# Patient Record
Sex: Female | Born: 1995 | Race: White | Hispanic: No | Marital: Single | Smoking: Never smoker
Health system: Southern US, Community
[De-identification: ages and names within clinical notes are randomized; demographics above are authoritative.]

## PROBLEM LIST (undated history)

## (undated) DIAGNOSIS — K589 Irritable bowel syndrome without diarrhea: Secondary | ICD-10-CM

## (undated) DIAGNOSIS — G43909 Migraine, unspecified, not intractable, without status migrainosus: Secondary | ICD-10-CM

---
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
## 2017-03-23 ENCOUNTER — Other Ambulatory Visit: Payer: Self-pay | Admitting: Family Medicine

## 2017-03-23 DIAGNOSIS — Z0000 Encounter for general adult medical examination without abnormal findings: Secondary | ICD-10-CM

## 2017-03-25 MED FILL — LARIN 24 FE 1 MG-20 MCG TAB: 1-20 | 28 days supply | Qty: 28 | Fill #0

## 2017-03-25 NOTE — Telephone Encounter (Signed)
Pt has transfer appt next Thursday, 9/13. However is out of her birth control LARIN 24 FE 1-20 MG-MCG(24) tablet  Can you send in one mo supply to get her through? Pt states she is out.  [facility] [place] - [address]

## 2017-03-25 NOTE — Telephone Encounter (Signed)
Pt is requesting for refills on her birth control medication last filled on 09/28/2016 28 tablets with 3 refills. Patient was last seen on 08/26/2016 .Please advised if ok to refill.

## 2017-03-25 NOTE — Telephone Encounter (Signed)
Patient aware that her birth control has been refilled x 1 month to [facility]. Nothing further needed.

## 2017-03-25 NOTE — Telephone Encounter (Signed)
Okay to refill? 

## 2017-04-01 ENCOUNTER — Encounter: Payer: Self-pay | Admitting: Family Medicine

## 2017-04-01 ENCOUNTER — Ambulatory Visit (INDEPENDENT_AMBULATORY_CARE_PROVIDER_SITE_OTHER): Payer: PRIVATE HEALTH INSURANCE | Admitting: Family Medicine

## 2017-04-01 VITALS — BP 136/84 | HR 112 | Temp 98.3°F | Ht 62.0 in | Wt 176.0 lb

## 2017-04-01 DIAGNOSIS — Z7689 Persons encountering health services in other specified circumstances: Secondary | ICD-10-CM | POA: Diagnosis not present

## 2017-04-01 DIAGNOSIS — Z3041 Encounter for surveillance of contraceptive pills: Secondary | ICD-10-CM

## 2017-04-01 MED ORDER — LARIN 24 FE 1-20 MG-MCG(24) PO TABS: 1.0000 | ORAL_TABLET | Freq: Every day | ORAL | 11 refills | Status: DC

## 2017-04-01 NOTE — Progress Notes (Signed)
Patient presents to clinic today to re-establish care and for birth control.  SUBJECTIVE: PMH: Pt is a 21 yo CF with no sig pmh.  She is here today for refill on OCPs.  Pt has been on Larin with no issues x 2 yrs.  Pt does not smoke and does not have a h/o blood clots.  Denies HA, changes in vision, h/o anemia.  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  She does not smoke, drink, or use drugs.    Health Maintenance: Immunizations -- not interested in influenza 2/2 still getting the flu after receiving a flu shot. PAP --  Not indicated, not 5 yo yet  FMHx: Mom: AAW Dad: AAW MGM: chronic pneumonia MGF: tobacco abuse, lung ca PGM: lung cancer, tobacco use PGF: tobacco abuse, lung ca  No past medical history on file.  No past surgical history on file.  Current Outpatient Prescriptions on File Prior to Visit  Medication Sig Dispense Refill  . hyoscyamine (LEVSIN, ANASPAZ) 0.125 MG tablet Take 1 tablet (0.125 mg total) by mouth every 4 (four) hours as needed. 30 tablet 0   No current facility-administered medications on file prior to visit.     No Known Allergies  Family History  Problem Relation Age of Onset  . Alcohol abuse Paternal Uncle   . Alcohol abuse Maternal Grandfather     Social History   Social History  . Marital status: Single    Spouse name: N/A  . Number of children: N/A  . Years of education: N/A   Occupational History  . Not on file.   Social History Main Topics  . Smoking status: Never Smoker  . Smokeless tobacco: Never Used  . Alcohol use No  . Drug use: No  . Sexual activity: Yes    Birth control/ protection: OCP   Other Topics Concern  . 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   One sister who is 16    ROS  General: Denies fever, chills, night sweats, changes in weight, changes in appetite HEENT:  Denies headaches, ear pain, changes in vision, rhinorrhea, sore throat CV: Denies CP, palpitations, SOB, orthopnea Pulm: Denies SOB, cough, wheezing GI: Denies abdominal pain, nausea, vomiting, diarrhea, constipation GU: Denies dysuria, hematuria, frequency, vaginal discharge Msk: Denies muscle cramps, joint pains Neuro: Denies weakness, numbness, tingling Skin: Denies rashes, bruising Psych: Denies depression, anxiety, hallucinations   BP 136/84 (BP Location: Left Arm, Patient Position: Sitting, Cuff Size: Normal)   Pulse (!) 112   Temp 98.3 F (36.8 C) (Oral)   Ht  (1.575 m)   Wt 176 lb (79.8 kg)   SpO2 98%   BMI 32.19 kg/m   Physical Exam Gen. Pleasant, well developed, well-nourished, in NAD HEENT - [place]/AT, PERRL, no scleral icterus, no nasal drainage, pharynx without erythema or exudate. Neck: No JVD, no thyromegaly Lungs: no accessory muscle use, no dullness to percussion, CTAB, no wheezes, rales or rhonchi Cardiovascular: RRR, No r/g/m, no peripheral edema Abdomen: BS present, soft, nontender,nondistended, no hepatosplenomegaly Musculoskeletal: No deformities, moves all four extremities, no cyanosis or clubbing, normal tone Neuro:  A&Ox3, CN II-XII intact, normal gait Skin:  Warm, dry, intact, no lesions Psych: normal affect, mood appropriate  No results found for this or any previous visit (from the past 2160 hour(s)).  Assessment/Plan: Surveillance for birth control, oral contraceptives  -  Plan: LARIN 24 FE 1-20 MG-MCG(24) tablet  Encounter to establish care -f/u prn.  Can schedule CPE in next few months -declined flu vaccine

## 2017-04-08 ENCOUNTER — Encounter: Payer: Self-pay | Admitting: Family Medicine

## 2017-04-22 MED FILL — LARIN 24 FE 1 MG-20 MCG TAB: 1-20 | 28 days supply | Qty: 28 | Fill #0

## 2017-05-17 MED FILL — LARIN 24 FE 1 MG-20 MCG TAB: 1-20 | 28 days supply | Qty: 28 | Fill #1

## 2017-05-18 ENCOUNTER — Other Ambulatory Visit: Payer: Self-pay | Admitting: Emergency Medicine

## 2017-05-18 DIAGNOSIS — Z3041 Encounter for surveillance of contraceptive pills: Secondary | ICD-10-CM

## 2017-05-18 MED ORDER — LARIN 24 FE 1-20 MG-MCG(24) PO TABS
1.0000 | ORAL_TABLET | Freq: Every day | ORAL | 3 refills | Status: DC
Start: 2017-05-18 — End: 2017-06-21

## 2017-06-18 ENCOUNTER — Telehealth: Payer: Self-pay | Admitting: Family Medicine

## 2017-06-18 MED FILL — BLISOVI 24 FE TABLET: 1-20 | 28 days supply | Qty: 28 | Fill #2

## 2017-06-18 NOTE — Telephone Encounter (Signed)
Copied from CRM [id number]. Topic: Quick Communication - See Telephone Encounter >> Jun 18, 2017  1:47 PM Diana EvesHoyt, Maryann B wrote: CRM for notification. See Telephone encounter for:  Pharmacy no longer carries the larin but has an equivalent and would like a sign off Blisovi 24FE  06/18/17. >> Jun 18, 2017  4:40 PM Cipriano BunkerLambe, Annette S wrote: Patient called - pharmacy closes at 5 ([facility] Medication - birth control CRM sent from pharmacy. Patient said if cant by 5pm call into [facility] [address] [place]

## 2017-06-18 NOTE — Telephone Encounter (Signed)
Please advise 

## 2017-06-18 NOTE — Telephone Encounter (Signed)
Copied from CRM [id number]. Topic: Quick Communication - See Telephone Encounter >> Jun 18, 2017  1:47 PM Diana EvesHoyt, Maryann B wrote: CRM for notification. See Telephone encounter for:  Pharmacy no longer carries the larin but has an equivalent and would like a sign off Blisovi 24FE  06/18/17.

## 2017-06-19 NOTE — Telephone Encounter (Signed)
This was sent in error to me. Dr. Abbe AmsterdamShannon Banks saw her. I routed to her

## 2017-06-21 ENCOUNTER — Other Ambulatory Visit: Payer: Self-pay | Admitting: Emergency Medicine

## 2017-06-21 MED ORDER — NORETHIN ACE-ETH ESTRAD-FE 1-20 MG-MCG(24) PO TABS: 1.0000 | ORAL_TABLET | Freq: Every day | ORAL | 4 refills | Status: DC

## 2017-06-21 NOTE — Telephone Encounter (Signed)
Medication has been sent to patient pharmacy.

## 2017-06-21 NOTE — Telephone Encounter (Signed)
Dr.Banks pt 

## 2017-06-21 NOTE — Telephone Encounter (Signed)
That's fine

## 2017-07-19 MED FILL — BLISOVI 24 FE TABLET: 1-20 | 28 days supply | Qty: 28 | Fill #3

## 2017-09-07 ENCOUNTER — Ambulatory Visit: Payer: PRIVATE HEALTH INSURANCE | Admitting: Family Medicine

## 2017-09-07 ENCOUNTER — Encounter: Payer: Self-pay | Admitting: *Deleted

## 2017-09-07 ENCOUNTER — Encounter: Payer: Self-pay | Admitting: Family Medicine

## 2017-09-07 VITALS — BP 120/78 | HR 88 | Temp 98.3°F | Resp 12 | Ht 62.01 in | Wt 184.4 lb

## 2017-09-07 DIAGNOSIS — J069 Acute upper respiratory infection, unspecified: Secondary | ICD-10-CM | POA: Diagnosis not present

## 2017-09-07 DIAGNOSIS — J029 Acute pharyngitis, unspecified: Secondary | ICD-10-CM

## 2017-09-07 LAB — POCT RAPID STREP A (OFFICE): RAPID STREP A SCREEN: NEGATIVE

## 2017-09-07 MED ORDER — FLUTICASONE PROPIONATE 50 MCG/ACT NA SUSP: 1.0000 | Freq: Two times a day (BID) | NASAL | 0 refills | Status: DC

## 2017-09-07 MED ORDER — MAGIC MOUTHWASH W/LIDOCAINE: 5.0000 mL | Freq: Three times a day (TID) | ORAL | 0 refills | Status: AC | PRN

## 2017-09-07 NOTE — Patient Instructions (Signed)
  Jennifer Roach I have seen you today for an acute visit.  A few things to remember from today's visit:   Sore throat - Plan: POCT rapid strep A, magic mouthwash w/lidocaine SOLN  URI, acute - Plan: fluticasone (FLONASE) 50 MCG/ACT nasal spray    Symptomatic treatment: Over the counter Acetaminophen 500 mg and/or Ibuprofen (400-600 mg) if there is not contraindications; you can alternate in between both every 4-6 hours. Gargles with saline water and throat lozenges might also help. Cold fluids.    Seek prompt medical evaluation if you are having difficulty breathing, mouth swelling, throat closing up, not able to swallow liquids (drooling), skin rash/bruising, or worsening symptoms.  Please follow up in 2 weeks if not any better.      In general please monitor for signs of worsening symptoms and seek immediate medical attention if any concerning.    I hope you get better soon!

## 2017-09-07 NOTE — Progress Notes (Signed)
ACUTE VISIT  HPI:  Chief Complaint  Patient presents with  . Sore Throat    painful when swallowing, started 4 days ago  . Cough    Ms.Jennifer RegisterRebecca L Roach is a 22 y.o.female here today complaining of 4 days of sore throat. Pain is mild to moderate, exacerbated by swallowing. She denies dysphagia or stridor.  Sore Throat   This is a new problem. The current episode started in the past 7 days. The problem has been unchanged. There has been no fever. The pain is moderate. Associated symptoms include congestion and coughing. Pertinent negatives include no abdominal pain, diarrhea, ear pain, headaches, hoarse voice, plugged ear sensation, neck pain, shortness of breath, stridor, swollen glands, trouble swallowing or vomiting. She has had no exposure to strep or mono. She has tried nothing for the symptoms.    + Nasal congestion, rhinorrhea, and post nasal drainage.   No Hx of recent travel. No known sick contact, but she works in a [employer]. No known insect bite.  Hx of allergies: Denies  She has not try OTC analgesics.  Symptoms otherwise stable.   Review of Systems  Constitutional: Positive for chills and fatigue. Negative for activity change, appetite change and fever.  HENT: Positive for congestion, postnasal drip, rhinorrhea and sore throat. Negative for ear pain, facial swelling, hoarse voice, mouth sores, sinus pressure, sneezing, trouble swallowing and voice change.   Eyes: Negative for discharge and redness.  Respiratory: Positive for cough. Negative for shortness of breath, wheezing and stridor.   Gastrointestinal: Negative for abdominal pain, diarrhea, nausea and vomiting.  Musculoskeletal: Negative for myalgias and neck pain.  Skin: Negative for rash.  Allergic/Immunologic: Negative for environmental allergies.  Neurological: Negative for weakness and headaches.  Hematological: Negative for adenopathy. Does not bruise/bleed easily.       Current  Outpatient Medications on File Prior to Visit  Medication Sig Dispense Refill  . hyoscyamine (LEVSIN, ANASPAZ) 0.125 MG tablet Take 1 tablet (0.125 mg total) by mouth every 4 (four) hours as needed. 30 tablet 0  . Norethindrone Acetate-Ethinyl Estrad-FE (BLISOVI 24 FE) 1-20 MG-MCG(24) tablet Take 1 tablet by mouth daily. 1 Package 4   No current facility-administered medications on file prior to visit.      History reviewed. No pertinent past medical history. Allergies  Allergen Reactions  . No Known Allergies     Social History   Socioeconomic History  . Marital status: Single    Spouse name: None  . Number of children: None  . Years of education: None  . Highest education level: None  Social Needs  . Financial resource strain: None  . Food insecurity - worry: None  . Food insecurity - inability: None  . Transportation needs - medical: None  . Transportation needs - non-medical: None  Occupational History  . None  Tobacco Use  . Smoking status: Never Smoker  . Smokeless tobacco: Never Used  Substance and Sexual Activity  . Alcohol use: No    Alcohol/week: 0.0 oz  . Drug use: No  . Sexual activity: Yes    Birth control/protection: OCP  Other Topics Concern  . 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   Physical Exam  Nursing note and vitals reviewed. Constitutional: She is oriented to person, place, and time. She appears well-developed. She does not appear ill. No distress.  HENT:  Head: Normocephalic and atraumatic.  Nose: Rhinorrhea present. Right sinus exhibits no maxillary sinus tenderness and no frontal sinus tenderness. Left sinus exhibits no maxillary sinus tenderness and no frontal sinus tenderness.  Mouth/Throat: Uvula is midline and mucous  membranes are normal. Posterior oropharyngeal erythema present. No oropharyngeal exudate or posterior oropharyngeal edema.  Moderately hypertrophic turbinates  mouth breathing.   Eyes: Conjunctivae are normal.  Neck: No muscular tenderness present. No edema and no erythema present.  Cardiovascular: Normal rate and regular rhythm.  No murmur heard. Respiratory: Effort normal and breath sounds normal. No stridor. No respiratory distress.  Lymphadenopathy:       Head (right side): No submandibular adenopathy present.       Head (left side): No submandibular adenopathy present.    She has cervical adenopathy.       Right cervical: Posterior cervical adenopathy present.       Left cervical: Posterior cervical adenopathy present.  Neurological: She is alert and oriented to person, place, and time. She has normal strength. Gait normal.  Skin: Skin is warm. No rash noted. No erythema.  Psychiatric: She has a normal mood and affect.  Well groomed, good eye contact.    ASSESSMENT AND PLAN:   [name] was seen today for sore throat and cough.  Diagnoses and all orders for this visit:  Sore throat -     POCT rapid strep A -     magic mouthwash w/lidocaine SOLN; Take 5 mLs by mouth 3 (three) times daily as needed for up to 10 days for mouth pain. 50 ml of diphenhydramine, alum and mag hydroxide, and lidocaine to make 150 ml -     Culture, Group A Strep  URI, acute -     fluticasone (FLONASE) 50 MCG/ACT nasal spray; Place 1 spray into both nostrils 2 (two) times daily. -     Culture, Group A Strep   Rapid strep negative. Strep Cx was sent. Symptoms suggests a viral etiology, symptomatic treatment recommended for now, I do not think abx is needed at this time. Instructed to monitor for signs of complications, including new onset of fever among some, clearly instructed about warning signs. We will follow throat culture and give recommendations accordingly. Note for work was given  today. I also explained that cough and nasal congestion can last a few days and sometimes weeks. F/U as needed.     -[name] was advised to seek attention immediately if symptoms worsen or to follow if they persist or new concerns arise.       Betty G. [place], MD  [facility]. [place] office.

## 2017-09-09 LAB — CULTURE, GROUP A STREP
MICRO NUMBER: 90218454
SPECIMEN QUALITY:: ADEQUATE

## 2017-09-12 ENCOUNTER — Encounter: Payer: Self-pay | Admitting: Family Medicine

## 2017-10-04 ENCOUNTER — Other Ambulatory Visit: Payer: Self-pay | Admitting: Family Medicine

## 2017-10-04 DIAGNOSIS — J069 Acute upper respiratory infection, unspecified: Secondary | ICD-10-CM

## 2017-10-11 ENCOUNTER — Other Ambulatory Visit: Payer: Self-pay | Admitting: *Deleted

## 2017-10-11 DIAGNOSIS — J069 Acute upper respiratory infection, unspecified: Secondary | ICD-10-CM

## 2017-10-11 MED ORDER — FLUTICASONE PROPIONATE 50 MCG/ACT NA SUSP: 1.0000 | Freq: Two times a day (BID) | NASAL | 1 refills | Status: DC

## 2017-11-16 ENCOUNTER — Other Ambulatory Visit: Payer: Self-pay | Admitting: *Deleted

## 2017-11-16 DIAGNOSIS — J069 Acute upper respiratory infection, unspecified: Secondary | ICD-10-CM

## 2017-11-16 MED ORDER — FLUTICASONE PROPIONATE 50 MCG/ACT NA SUSP: 1.0000 | Freq: Two times a day (BID) | NASAL | 2 refills | Status: DC

## 2017-12-01 ENCOUNTER — Other Ambulatory Visit: Payer: Self-pay | Admitting: Family Medicine

## 2018-02-24 ENCOUNTER — Other Ambulatory Visit: Payer: Self-pay | Admitting: Family Medicine

## 2018-03-08 ENCOUNTER — Encounter: Payer: Self-pay | Admitting: Family Medicine

## 2018-03-08 ENCOUNTER — Encounter: Payer: Self-pay | Admitting: *Deleted

## 2018-03-08 ENCOUNTER — Ambulatory Visit: Payer: PRIVATE HEALTH INSURANCE | Admitting: Family Medicine

## 2018-03-08 VITALS — BP 124/80 | HR 100 | Temp 97.7°F | Resp 12 | Ht 62.01 in | Wt 185.1 lb

## 2018-03-08 DIAGNOSIS — J069 Acute upper respiratory infection, unspecified: Secondary | ICD-10-CM

## 2018-03-08 DIAGNOSIS — R05 Cough: Secondary | ICD-10-CM | POA: Diagnosis not present

## 2018-03-08 DIAGNOSIS — R059 Cough, unspecified: Secondary | ICD-10-CM

## 2018-03-08 MED ORDER — BENZONATATE 100 MG PO CAPS: 200.0000 mg | ORAL_CAPSULE | Freq: Two times a day (BID) | ORAL | 0 refills | Status: AC | PRN

## 2018-03-08 NOTE — Patient Instructions (Addendum)
A few things to remember from today's visit:   Cough - Plan: benzonatate (TESSALON) 100 MG capsule  URI, acute  viral infections are self-limited and we treat each symptom depending of severity.  Over the counter medications as decongestants and cold medications usually help, they need to be taken with caution if there is a history of high blood pressure or palpitations. Tylenol and/or Ibuprofen also helps with most symptoms (headache, muscle aching, fever,etc) Plenty of fluids. Honey helps with cough. Steam inhalations helps with runny nose, nasal congestion, and may prevent sinus infections. Cough and nasal congestion could last a few days and sometimes weeks. Please follow in not any better in 1-2 weeks or if symptoms get worse.  Please be sure medication list is accurate. If a new problem present, please set up appointment sooner than planned today.        

## 2018-03-08 NOTE — Progress Notes (Signed)
ACUTE VISIT  HPI:  Chief Complaint  Patient presents with  . Cough    x 3 days  . Nasal Congestion    Jennifer Roach is a 22 y.o.female here today complaining of 3 days of respiratory symptoms.  Nonproductive cough. She denies dyspnea but feels like she cannot catch her breath during coughing spells. She has not identified exacerbating or alleviating factors. She did not feel like going to work yesterday,today because of persistent cough she was sent home.  Yesterday she had nausea and one episode of vomiting. Diarrhea X 3 yesterday,none today. Negative for blood or mucus in stool. She also has some "bad stomach" pain, that resolved after defecation. She is not having GI symptoms today.  URI   This is a new problem. The current episode started in the past 7 days. The problem has been unchanged. There has been no fever. Associated symptoms include congestion, coughing, diarrhea, rhinorrhea, a sore throat and vomiting. Pertinent negatives include no abdominal pain, dysuria, ear pain, headaches, joint swelling, nausea, neck pain, plugged ear sensation, rash, swollen glands or wheezing. She has tried acetaminophen for the symptoms. The treatment provided mild relief.   Nonproductive cough. Moderate nasal congestion, rhinorrhea, mild sore throat, and post nasal drainage.  She has not noted fever but she has had some chills and body aches,worse yesterday.  No Hx of recent travel. No sick contact. No known insect bite. No swimming in public pools or lakes recently.  Hx of allergies: Allergic rhinitis, she is on Flonase nasal spray as needed.  She has not use it in a while. No history of asthma.  OTC medications for this problem: DayQuil.   Review of Systems  Constitutional: Positive for activity change, appetite change, chills and fatigue. Negative for fever.  HENT: Positive for congestion, postnasal drip, rhinorrhea, sore throat and voice change. Negative for ear  pain, mouth sores, sinus pressure and trouble swallowing.   Eyes: Negative for discharge and redness.  Respiratory: Positive for cough. Negative for shortness of breath, wheezing and stridor.   Cardiovascular: Negative for leg swelling.  Gastrointestinal: Positive for diarrhea and vomiting. Negative for abdominal pain and nausea.  Genitourinary: Negative for dysuria.  Musculoskeletal: Negative for myalgias and neck pain.  Skin: Negative for rash.  Allergic/Immunologic: Positive for environmental allergies.  Neurological: Negative for weakness and headaches.  Hematological: Negative for adenopathy. Does not bruise/bleed easily.      Current Outpatient Medications on File Prior to Visit  Medication Sig Dispense Refill  . BLISOVI 24 FE 1-20 MG-MCG(24) tablet TAKE 1 TABLET EVERY DAY 84 tablet 0  . fluticasone (FLONASE) 50 MCG/ACT nasal spray Place 1 spray into both nostrils 2 (two) times daily. 48 g 2   No current facility-administered medications on file prior to visit.      History reviewed. No pertinent past medical history. Allergies  Allergen Reactions  . No Known Allergies     Social History   Socioeconomic History  . Marital status: Single    Spouse name: Not on file  . Number of children: Not on file  . Years of education: Not on file  . Highest education level: Not on file  Occupational History  . Not on file  Social Needs  . Financial resource strain: Not on file  . Food insecurity:    Worry: Not on file    Inability: Not on file  . Transportation needs:    Medical: Not on file  Non-medical: Not on file  Tobacco Use  . Smoking status: Never Smoker  . Smokeless tobacco: Never Used  Substance and Sexual Activity  . Alcohol use: No    Alcohol/week: 0.0 standard drinks  . Drug use: No  . Sexual activity: Yes    Birth control/protection: OCP  Lifestyle  . Physical activity:    Days per week: Not on file    Minutes per session: Not on file  . Stress: Not  on file  Relationships  . Social connections:    Talks on phone: Not on file    Gets together: Not on file    Attends religious service: Not on file    Active member of club or organization: Not on file    Attends meetings of clubs or organizations: Not on file    Relationship status: Not on file  Other Topics Concern  . 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   Physical Exam  Nursing note and vitals reviewed. Constitutional: She is oriented to person, place, and time. She appears well-developed. She does not appear ill. No distress.  HENT:  Head: Normocephalic and atraumatic.  Right Ear: External ear and ear canal normal. Tympanic membrane is not erythematous and not bulging. A middle ear effusion is present.  Left Ear: Tympanic membrane, external ear and ear canal normal.  Nose: Rhinorrhea present. Right sinus exhibits no maxillary sinus tenderness and no frontal sinus tenderness. Left sinus exhibits no maxillary sinus tenderness and no frontal sinus tenderness.  Mouth/Throat: Uvula is midline and mucous membranes are normal. Posterior oropharyngeal erythema present. No oropharyngeal exudate or posterior oropharyngeal edema.  Mild dysphonia. Postnasal drainage. Hypertrophic Rubinas. Nasal voice.  Eyes: Conjunctivae are normal.  Cardiovascular: Normal rate and regular rhythm.  No murmur heard. Respiratory: Effort normal and breath sounds normal. No stridor. No respiratory distress. She has no wheezes. She has no rales.  Nonproductive cough a few times during visit.  GI: Soft. Bowel sounds are normal. She exhibits no mass. There is no tenderness.  Musculoskeletal: She exhibits no edema.  Lymphadenopathy:       Head (right side): No submandibular adenopathy  present.       Head (left side): No submandibular adenopathy present.    She has cervical adenopathy (Around 1 cm).       Right cervical: Posterior cervical adenopathy present.       Left cervical: Posterior cervical adenopathy present.  Neurological: She is alert and oriented to person, place, and time. She has normal strength. Gait normal.  Skin: Skin is warm. No rash noted. No erythema.  Psychiatric: She has a normal mood and affect.  Well groomed, good eye contact.     ASSESSMENT AND PLAN:   Jennifer Roach was seen today for cough and nasal congestion.  Diagnoses and all orders for this visit:  Cough -     benzonatate (TESSALON) 100 MG capsule; Take 2 capsules (200 mg total) by mouth 2 (two) times daily as needed for up to 10 days.  URI, acute   Symptoms suggests a viral etiology, symptomatic treatment recommended for now. I do not think abx is needed at this time. I do not think imaging is needed  at this time.  Instructed to monitor for signs of complications, including new onset of fever among some, instructed about warning signs. Continue OTC DayQuil and plain Mucinex. Adequate hydration. Nasal irrigation with saline as needed and starting Flonase intranasal spray. Recommend throat lozenges and gargles with saline to help with a sore throat. Letter for work given. I also explained that cough and nasal congestion can last a few days and sometimes weeks. F/U as needed.     Landy Mace G. [place], MD  [facility]. [place] office.

## 2018-05-15 ENCOUNTER — Other Ambulatory Visit: Payer: Self-pay | Admitting: Family Medicine

## 2018-10-06 ENCOUNTER — Encounter: Payer: Self-pay | Admitting: Adult Health

## 2018-10-06 ENCOUNTER — Telehealth: Payer: Self-pay

## 2018-10-06 ENCOUNTER — Ambulatory Visit (INDEPENDENT_AMBULATORY_CARE_PROVIDER_SITE_OTHER): Payer: PRIVATE HEALTH INSURANCE | Admitting: Internal Medicine

## 2018-10-06 ENCOUNTER — Other Ambulatory Visit: Payer: Self-pay

## 2018-10-06 ENCOUNTER — Encounter: Payer: Self-pay | Admitting: Internal Medicine

## 2018-10-06 ENCOUNTER — Telehealth: Payer: Self-pay | Admitting: Family Medicine

## 2018-10-06 VITALS — HR 113 | Temp 97.7°F

## 2018-10-06 DIAGNOSIS — R197 Diarrhea, unspecified: Secondary | ICD-10-CM

## 2018-10-06 DIAGNOSIS — J069 Acute upper respiratory infection, unspecified: Secondary | ICD-10-CM

## 2018-10-06 LAB — POCT INFLUENZA A/B
INFLUENZA A, POC: NEGATIVE
INFLUENZA B, POC: NEGATIVE

## 2018-10-06 LAB — POCT RAPID STREP A (OFFICE): RAPID STREP A SCREEN: NEGATIVE

## 2018-10-06 NOTE — [facility] Notes (Signed)
Subjective:    Patient ID: Jennifer Roach, female    DOB: 04-Aug-1995, 23 y.o.   MRN: [id number]  HPI 23 year old female who  has no past medical history on file.  Presents to the office today for an acute issue. She reports one week of headaches, diarrhea ( 4-5 episodes), feeling fatigued, and sore throat.   She denies fevers, chills, shortness of breath or cough. She does not have trouble swallowing.   She has been using OTC allergy medication    Review of Systems See HPI   No past medical history on file.  Social History   Socioeconomic History  . Marital status: Single    Spouse name: Not on file  . Number of children: Not on file  . Years of education: Not on file  . Highest education level: Not on file  Occupational History  . Not on file  Social Needs  . Financial resource strain: Not on file  . Food insecurity:    Worry: Not on file    Inability: Not on file  . Transportation needs:    Medical: Not on file    Non-medical: Not on file  Tobacco Use  . Smoking status: Never Smoker  . Smokeless tobacco: Never Used  Substance and Sexual Activity  . Alcohol use: No    Alcohol/week: 0.0 standard drinks  . Drug use: No  . Sexual activity: Yes    Birth control/protection: OCP  Lifestyle  . Physical activity:    Days per week: Not on file    Minutes per session: Not on file  . Stress: Not on file  Relationships  . Social connections:    Talks on phone: Not on file    Gets together: Not on file    Attends religious service: Not on file    Active member of club or organization: Not on file    Attends meetings of clubs or organizations: Not on file    Relationship status: Not on file  . Intimate partner violence:    Fear of current or ex partner: Not on file    Emotionally abused: Not on file    Physically abused: Not on file    Forced sexual activity: Not on file  Other Topics Concern  . Not on file  Social History Narrative   Entering college this fall  with a medical management major   One sister who is 16    No past surgical history on file.  Family History  Problem Relation Age of Onset  . Alcohol abuse Paternal Uncle   . Alcohol abuse Maternal Grandfather     Allergies  Allergen Reactions  . No Known Allergies     Current Outpatient Medications on File Prior to Visit  Medication Sig Dispense Refill  . BLISOVI 24 FE 1-20 MG-MCG(24) tablet TAKE 1 TABLET BY MOUTH EVERY DAY 28 tablet 0  . fluticasone (FLONASE) 50 MCG/ACT nasal spray Place 1 spray into both nostrils 2 (two) times daily. 48 g 2   No current facility-administered medications on file prior to visit.     Pulse (!) 113   Temp 97.7 F (36.5 C)   SpO2 97%       Objective:   Physical Exam Vitals signs and nursing note reviewed.  Constitutional:      Appearance: Normal appearance.  HENT:     Nose: Nose normal. No congestion or rhinorrhea.     Mouth/Throat:     Mouth: Mucous membranes  are moist.     Pharynx: Oropharynx is clear. No posterior oropharyngeal erythema.     Comments: Hypertrophic tonsils  Eyes:     Extraocular Movements: Extraocular movements intact.     Pupils: Pupils are equal, round, and reactive to light.  Cardiovascular:     Rate and Rhythm: Normal rate and regular rhythm.     Pulses: Normal pulses.     Heart sounds: Normal heart sounds.  Pulmonary:     Effort: Pulmonary effort is normal.     Breath sounds: Normal breath sounds.  Neurological:     General: No focal deficit present.     Mental Status: She is alert and oriented to person, place, and time.  Psychiatric:        Mood and Affect: Mood normal.        Behavior: Behavior normal.        Thought Content: Thought content normal.        Judgment: Judgment normal.       Assessment & Plan:  1. Upper respiratory tract infection, unspecified type - Likely viral.  - Advised rest and hydration  - POC Influenza A/B- negative  - POC Rapid Strep A- negative   2. Diarrhea,  unspecified type - ok to use Pepto  - POC Rapid Strep A   Shirline Frees, NP

## 2018-10-06 NOTE — Telephone Encounter (Signed)
Na

## 2018-10-06 NOTE — Telephone Encounter (Signed)
Questions for Screening COVID-19  Symptom onset:  Lethargic Headache Sore throat Taking Allergy meds x 1 week - not helped Congestion Cough Feverish Runny nose  Diarrhea  No chills, body aches   Travel or Contacts:  Works in [employer] Traveled to [place] - drove about 2.5 weeks ago Boyfriend dx with URI - fever, cough, congestion  During this illness, did/does the patient experience any of the following symptoms? Fever >100.20F []   Yes [x]   No []   Unknown Subjective fever (felt feverish) [x]   Yes []   No []   Unknown Chills []   Yes [x]   No []   Unknown Muscle aches (myalgia) []   Yes [x]   No []   Unknown Runny nose (rhinorrhea) [x]   Yes []   No []   Unknown Sore throat [x]   Yes []   No []   Unknown Cough (new onset or worsening of chronic cough) [x]   Yes []   No []   Unknown Shortness of breath (dyspnea) []   Yes [x]   No []   Unknown Nausea or vomiting []   Yes [x]   No []   Unknown Headache [x]   Yes []   No []   Unknown Abdominal pain  []   Yes [x]   No []   Unknown Diarrhea (?3 loose/looser than normal stools/24hr period) [x]   Yes []   No []   Unknown Other, specify:  Patient risk factors: Smoker? []   Current [x]   Former []   Never  VAPING If female, currently pregnant? []   Yes [x]   No  There are no active problems to display for this patient.   Plan:  []   High risk for COVID-19 with red flags go to ED (with CP, SOB, weak/lightheaded, or fever > 101.5). Call ahead.  [x]   High risk for COVID-19 but stable will have car visit. Inform provider and coordinate time. Will be completed in afternoon. []   No red flags but URI signs or symptoms will go through side door and be seen in dedicated room.  Note: Referral to telemedicine is an appropriate alternative disposition for higher risk but stable. [facility]/e-Visit: [phone].

## 2019-02-02 ENCOUNTER — Ambulatory Visit (INDEPENDENT_AMBULATORY_CARE_PROVIDER_SITE_OTHER): Payer: PRIVATE HEALTH INSURANCE | Admitting: Family Medicine

## 2019-02-02 ENCOUNTER — Encounter: Payer: Self-pay | Admitting: Family Medicine

## 2019-02-02 ENCOUNTER — Other Ambulatory Visit: Payer: Self-pay

## 2019-02-02 DIAGNOSIS — Z308 Encounter for other contraceptive management: Secondary | ICD-10-CM | POA: Diagnosis not present

## 2019-02-02 MED ORDER — BLISOVI 24 FE 1-20 MG-MCG(24) PO TABS: 1.0000 | ORAL_TABLET | Freq: Every day | ORAL | 11 refills | Status: DC

## 2019-02-02 NOTE — [facility] Notes (Signed)
Virtual Visit via Video Note  I connected with Jennifer Roach on 02/02/19 at  4:00 PM EDT by a video enabled telemedicine application and verified that I am speaking with the correct person using two identifiers.  Location patient: home Location provider:work or home office Persons participating in the virtual visit: patient, provider  I discussed the limitations of evaluation and management by telemedicine and the availability of in person appointments. The patient expressed understanding and agreed to proceed.   HPI: Pt is moving in 3 wks to [place] with her bf.  Pt is requesting a refill on OCPs.  LMP just ended 2 days ago.  Denies migraines, SOB, CP, edema.  Social Hx:  Pt and bf have been wanting to move "out [place]" for a while.  States they will be staying with his sister until they find a house to buy.  Pt has been trying to stay safe during the COVID 19 pandemic.  She has continued to work as she is essential.  Pt works as a Sports coach.  ROS: See pertinent positives and negatives per HPI.  No past medical history on file.  No past surgical history on file.  Family History  Problem Relation Age of Onset  . Alcohol abuse Paternal Uncle   . Alcohol abuse Maternal Grandfather      Current Outpatient Medications:  .  BLISOVI 24 FE 1-20 MG-MCG(24) tablet, TAKE 1 TABLET BY MOUTH EVERY DAY, Disp: 28 tablet, Rfl: 0 .  fluticasone (FLONASE) 50 MCG/ACT nasal spray, Place 1 spray into both nostrils 2 (two) times daily., Disp: 48 g, Rfl: 2  EXAM:  VITALS per patient if applicable:  RR between 12-20 bpm  GENERAL: alert, oriented, appears well and in no acute distress  HEENT: atraumatic, conjunctiva clear, no obvious abnormalities on inspection of external nose and ears  NECK: normal movements of the head and neck  LUNGS: on inspection no signs of respiratory distress, breathing rate appears normal, no obvious gross SOB, gasping or wheezing  CV: no obvious cyanosis  MS: moves  all visible extremities without noticeable abnormality  PSYCH/NEURO: pleasant and cooperative, no obvious depression or anxiety, speech and thought processing grossly intact  ASSESSMENT AND PLAN:  Discussed the following assessment and plan:  Encounter for other contraceptive management  - Plan: Norethindrone Acetate-Ethinyl Estrad-FE (BLISOVI 24 FE) 1-20 MG-MCG(24) tablet  I discussed the assessment and treatment plan with the patient. The patient was provided an opportunity to ask questions and all were answered. The patient agreed with the plan and demonstrated an understanding of the instructions.   The patient was advised to call back or seek an in-person evaluation if the symptoms worsen or if the condition fails to improve as anticipated.   Billie Ruddy, MD

## 2019-06-19 ENCOUNTER — Telehealth (INDEPENDENT_AMBULATORY_CARE_PROVIDER_SITE_OTHER): Payer: PRIVATE HEALTH INSURANCE | Admitting: Family Medicine

## 2019-06-19 DIAGNOSIS — K589 Irritable bowel syndrome without diarrhea: Secondary | ICD-10-CM | POA: Insufficient documentation

## 2019-06-19 DIAGNOSIS — J069 Acute upper respiratory infection, unspecified: Secondary | ICD-10-CM

## 2019-06-19 DIAGNOSIS — Z7189 Other specified counseling: Secondary | ICD-10-CM

## 2019-06-19 NOTE — [facility] Notes (Signed)
Virtual Visit via Video Note  I connected with Jennifer Roach on 06/19/19 at 11:30 AM EST by a video enabled telemedicine application 2/2 ZYSAY-30 pandemic and verified that I am speaking with the correct person using two identifiers.  Location patient: home Location provider:work or home office Persons participating in the virtual visit: patient, provider  I discussed the limitations of evaluation and management by telemedicine and the availability of in person appointments. The patient expressed understanding and agreed to proceed.   HPI: Pt is a 23 yo female with pmh sig for IBS-D.  Pt seen for acute concern of nasal congestion, sore throat, cough, HAs, chills, decreased appetite, and lethargy x 4-5 days.  Symptoms felt worse on Saturday.  Pt denies fever (doesn't have a thermometer), ear pain or pressure, facial pain or pressure, loss of taste or smell, diarrhea, sick contacts.  Taking Dayquil and nyquil.   ROS: See pertinent positives and negatives per HPI.  No past medical history on file.  No past surgical history on file.  Family History  Problem Relation Age of Onset  . Alcohol abuse Paternal Uncle   . Alcohol abuse Maternal Grandfather     Current Outpatient Medications:  .  fluticasone (FLONASE) 50 MCG/ACT nasal spray, Place 1 spray into both nostrils 2 (two) times daily., Disp: 48 g, Rfl: 2 .  Norethindrone Acetate-Ethinyl Estrad-FE (BLISOVI 24 FE) 1-20 MG-MCG(24) tablet, Take 1 tablet by mouth daily., Disp: 28 tablet, Rfl: 11  EXAM:  VITALS per patient if applicable: RR between 16-01 bpm  GENERAL: alert, oriented, appears well, sounds congested, and in no acute distress  HEENT: atraumatic, conjunctiva clear, no obvious abnormalities on inspection of external nose and ears  NECK: normal movements of the head and neck  LUNGS: Intermittent cough, on inspection no signs of respiratory distress, breathing rate appears normal, no obvious gross SOB, gasping or  wheezing  CV: no obvious cyanosis  MS: moves all visible extremities without noticeable abnormality  PSYCH/NEURO: pleasant and cooperative, no obvious depression or anxiety, speech and thought processing grossly intact  ASSESSMENT AND PLAN:  Discussed the following assessment and plan:  Viral URI with cough  -discussed supportive care -pt to start using flonase.  Offered rx, declined as has some at home. -consider COVID testing for continued of worsening symptoms.   -advised to self quarantine. -given precautions  Educated about COVID-19 infection -discussed s/s -given info on area testing sites  F/u prn   I discussed the assessment and treatment plan with the patient. The patient was provided an opportunity to ask questions and all were answered. The patient agreed with the plan and demonstrated an understanding of the instructions.  The patient was advised to call back or seek an in-person evaluation if the symptoms worsen or if the condition fails to improve as anticipated.  Billie Ruddy, MD

## 2019-08-03 ENCOUNTER — Encounter: Payer: Self-pay | Admitting: Family Medicine

## 2019-08-07 NOTE — Telephone Encounter (Signed)
Called pt left a voicemail for pt to call the office regarding her MyChart request for a referral to neurologist

## 2019-08-25 ENCOUNTER — Telehealth (INDEPENDENT_AMBULATORY_CARE_PROVIDER_SITE_OTHER): Payer: PRIVATE HEALTH INSURANCE | Admitting: Family Medicine

## 2019-08-25 DIAGNOSIS — G43909 Migraine, unspecified, not intractable, without status migrainosus: Secondary | ICD-10-CM | POA: Diagnosis not present

## 2019-08-25 MED ORDER — ONDANSETRON HCL 4 MG PO TABS: 4.0000 mg | ORAL_TABLET | Freq: Three times a day (TID) | ORAL | 2 refills | Status: DC | PRN

## 2019-08-25 MED ORDER — RIZATRIPTAN BENZOATE 5 MG PO TABS: 5.0000 mg | ORAL_TABLET | ORAL | 2 refills | Status: DC | PRN

## 2019-08-25 NOTE — [facility] Notes (Signed)
Virtual Visit via Video Note  I connected with Jennifer Roach on 08/25/19 at  9:30 AM EST by a video enabled telemedicine application 2/2 COVID-19 pandemic and verified that I am speaking with the correct person using two identifiers.  Location patient: home Location provider:work or home office Persons participating in the virtual visit: patient, provider  I discussed the limitations of evaluation and management by telemedicine and the availability of in person appointments. The patient expressed understanding and agreed to proceed.   HPI: Pt with h/o migraines since 6 th grade.  HAs increasing in frequency.   Having a HA once a wk, last one or 2 days with nausea and lightheadedness.  Pain is in in R parietal area or temporal area.   Pt endorses light and sound sensitivity.  May see spots in vision. Will use a cold compress on head, may turn on a fan, and take OTC migraine medication.  Pt does not recall being on rx meds for migraines.  Keeping track of food and beverage intake as dieting.  Lost 20 lbs.  Doing keto diet, but has carbs every other day.  Cut down caffeine intake, 1 cup of coffee in am.  Drinking 100 +oz of water per day.  Denies increased stress.  ROS: See pertinent positives and negatives per HPI.  No past medical history on file.  No past surgical history on file.  Family History  Problem Relation Age of Onset  . Alcohol abuse Paternal Uncle   . Alcohol abuse Maternal Grandfather      Current Outpatient Medications:  .  fluticasone (FLONASE) 50 MCG/ACT nasal spray, Place 1 spray into both nostrils 2 (two) times daily., Disp: 48 g, Rfl: 2 .  Norethindrone Acetate-Ethinyl Estrad-FE (BLISOVI 24 FE) 1-20 MG-MCG(24) tablet, Take 1 tablet by mouth daily., Disp: 28 tablet, Rfl: 11  EXAM:  VITALS per patient if applicable:  RR between 12-20 bpm  GENERAL: alert, oriented, appears well and in no acute distress  HEENT: atraumatic, conjunctiva clear, no obvious abnormalities  on inspection of external nose and ears  NECK: normal movements of the head and neck  LUNGS: on inspection no signs of respiratory distress, breathing rate appears normal, no obvious gross SOB, gasping or wheezing  CV: no obvious cyanosis  MS: moves all visible extremities without noticeable abnormality  PSYCH/NEURO: pleasant and cooperative, no obvious depression or anxiety, speech and thought processing grossly intact  ASSESSMENT AND PLAN:  Discussed the following assessment and plan:  Migraine without status migrainosus, not intractable, unspecified migraine type  -continue food diary, limiting caffeine, and getting plenty of rest.  Consider regular exercise. -will start Maxalt prn -consider imaging and Neurology referral for continued or worsening symptoms - Plan: rizatriptan (MAXALT) 5 MG tablet, ondansetron (ZOFRAN) 4 MG tablet  F/u in 1 month, sooner if needed.   I discussed the assessment and treatment plan with the patient. The patient was provided an opportunity to ask questions and all were answered. The patient agreed with the plan and demonstrated an understanding of the instructions.   The patient was advised to call back or seek an in-person evaluation if the symptoms worsen or if the condition fails to improve as anticipated.   Deeann Saint, MD

## 2019-09-23 ENCOUNTER — Ambulatory Visit: Payer: PRIVATE HEALTH INSURANCE | Attending: Internal Medicine

## 2019-09-23 DIAGNOSIS — Z23 Encounter for immunization: Secondary | ICD-10-CM | POA: Insufficient documentation

## 2019-09-23 NOTE — [facility] Notes (Signed)
   Covid-19 Vaccination Clinic  Name:  Jennifer Roach    MRN: [id number] DOB: 1996/01/08  09/23/2019  [name] was observed post Covid-19 immunization for 15 minutes without incident. She was provided with Vaccine Information Sheet and instruction to access the V-Safe system.   [name] was instructed to call 911 with any severe reactions post vaccine: Marland Kitchen Difficulty breathing  . Swelling of face and throat  . A fast heartbeat  . A bad rash all over body  . Dizziness and weakness   Immunizations Administered    Name Date Dose VIS Date Route   Pfizer COVID-19 Vaccine 09/23/2019  9:38 AM 0.3 mL 06/30/2019 Intramuscular   Manufacturer: [employer], [employer]   Lot: [id number]   NDC: [address]-4

## 2019-10-14 ENCOUNTER — Ambulatory Visit: Payer: PRIVATE HEALTH INSURANCE | Attending: Internal Medicine

## 2019-10-14 DIAGNOSIS — Z23 Encounter for immunization: Secondary | ICD-10-CM

## 2019-10-14 NOTE — [facility] Notes (Signed)
   Covid-19 Vaccination Clinic  Name:  Jennifer Roach    MRN: [id number] DOB: [birth year]  10/14/2019  [name] was observed post Covid-19 immunization for 15 minutes without incident. She was provided with Vaccine Information Sheet and instruction to access the V-Safe system.   [name] was instructed to call 911 with any severe reactions post vaccine: Marland Kitchen Difficulty breathing  . Swelling of face and throat  . A fast heartbeat  . A bad rash all over body  . Dizziness and weakness   Immunizations Administered    Name Date Dose VIS Date Route   Pfizer COVID-19 Vaccine 10/14/2019  9:20 AM 0.3 mL 06/30/2019 Intramuscular   Manufacturer: [employer], [employer]   Lot: [id number]   NDC: [address]-4

## 2019-11-14 ENCOUNTER — Ambulatory Visit (INDEPENDENT_AMBULATORY_CARE_PROVIDER_SITE_OTHER)
Admission: RE | Admit: 2019-11-14 | Discharge: 2019-11-14 | Disposition: A | Discharge status: Home / Self Care | Payer: PRIVATE HEALTH INSURANCE | Source: Ambulatory Visit

## 2019-11-14 ENCOUNTER — Other Ambulatory Visit: Payer: Self-pay

## 2019-11-14 DIAGNOSIS — G43909 Migraine, unspecified, not intractable, without status migrainosus: Secondary | ICD-10-CM

## 2019-11-14 MED ORDER — KETOROLAC TROMETHAMINE 10 MG PO TABS: 10.0000 mg | ORAL_TABLET | Freq: Four times a day (QID) | ORAL | 0 refills | Status: DC | PRN

## 2019-11-14 NOTE — ED Provider Notes (Signed)
Virtual Visit via Video Note:  SUTTON PLAKE  initiated request for Telemedicine visit with [facility] Ur[facility] team. I connected with Richardean Canal  on 11/14/2019 at 9:40 AM  for a synchronized telemedicine visit using a video enabled HIPPA compliant telemedicine application. I verified that I am speaking with Richardean Canal  using two identifiers. Sharion Balloon, NP  was physically located in a [facility] Urgent care site and CAITLAN CHAUCA was located at a different location.   The limitations of evaluation and management by telemedicine as well as the availability of in-person appointments were discussed. Patient was informed that she  may incur a bill ( including co-pay) for this virtual visit encounter. Richardean Canal  expressed understanding and gave verbal consent to proceed with virtual visit.  History of Present Illness:Jennifer Roach  is a 24 y.o. female presents for evaluation of migraine headache x 3 days.  She has taken Maxalt without relief; she also attempted treatment with OTC allergy medication.  She has a history of migraines; she was seen for this by her PCP on 08/25/2019; prescribed Maxalt at that time.  She denies focal weakness, dizziness, chest pain, shortness of breath, fever, chills, congestion, cough, ear pain, sore throat, vomiting, or other symptoms.  She denies pregnancy or breastfeeding.     Allergies  Allergen Reactions  . No Known Allergies      History reviewed. No pertinent past medical history.   Social History   Tobacco Use  . Smoking status: Never Smoker  . Smokeless tobacco: Never Used  Substance Use Topics  . Alcohol use: No    Alcohol/week: 0.0 standard drinks  . Drug use: No   ROS: as stated in HPI.  All other systems reviewed and negative.      Observations/Objective: Physical Exam  VITALS: Patient denies fever. GENERAL: Alert, appears well and in no acute distress. HEENT: Atraumatic. NECK: Normal movements of the head and  neck. CARDIOPULMONARY: No increased WOB. Speaking in clear sentences. I:E ratio WNL.  MS: Moves all visible extremities without noticeable abnormality. PSYCH: Pleasant and cooperative, well-groomed. Speech normal rate and rhythm. Affect is appropriate. Insight and judgement are appropriate. Attention is focused, linear, and appropriate.  NEURO: CN grossly intact. Oriented as arrived to appointment on time with no prompting. Moves both UE equally.  SKIN: No obvious lesions, wounds, erythema, or cyanosis noted on face or hands.   Assessment and Plan:    ICD-10-CM   1. Migraine without status migrainosus, not intractable, unspecified migraine type  G43.909        Follow Up Instructions: Treating headache with oral Toradol.  Instructed patient to call her PCP to schedule an appointment to discuss long-term management of her migraine headaches.  Instructed her to come here to be seen in person or go to her PCP to be seen in person if her symptoms do not improve with the Toradol.  Patient agrees to plan of care.      I discussed the assessment and treatment plan with the patient. The patient was provided an opportunity to ask questions and all were answered. The patient agreed with the plan and demonstrated an understanding of the instructions.   The patient was advised to call back or seek an in-person evaluation if the symptoms worsen or if the condition fails to improve as anticipated.      Sharion Balloon, NP  11/14/2019 9:40 AM  Mickie Bail, NP 11/14/19 [phone]

## 2019-11-14 NOTE — Discharge Instructions (Signed)
Take the Toradol every 6 hours as needed for relief of your headache.    Call your primary care provider to schedule an appointment as soon as possible to discuss long-term management of your migraine headaches.    Come here to be seen in person or go see your primary care provider if this medication does not improve your headache.

## 2019-12-21 ENCOUNTER — Other Ambulatory Visit: Payer: Self-pay | Admitting: Family Medicine

## 2019-12-21 DIAGNOSIS — Z308 Encounter for other contraceptive management: Secondary | ICD-10-CM

## 2020-03-08 ENCOUNTER — Other Ambulatory Visit: Payer: Self-pay

## 2020-03-08 ENCOUNTER — Ambulatory Visit
Admission: RE | Admit: 2020-03-08 | Discharge: 2020-03-08 | Disposition: A | Discharge status: Home / Self Care | Payer: PRIVATE HEALTH INSURANCE | Source: Ambulatory Visit | Attending: Family Medicine | Admitting: Family Medicine

## 2020-03-08 VITALS — BP 137/87 | HR 83 | Temp 98.4°F | Resp 17 | Ht 62.0 in | Wt 200.0 lb

## 2020-03-08 DIAGNOSIS — L309 Dermatitis, unspecified: Secondary | ICD-10-CM

## 2020-03-08 DIAGNOSIS — R109 Unspecified abdominal pain: Secondary | ICD-10-CM | POA: Diagnosis not present

## 2020-03-08 DIAGNOSIS — R197 Diarrhea, unspecified: Secondary | ICD-10-CM

## 2020-03-08 HISTORY — DX: Irritable bowel syndrome without diarrhea: K58.9

## 2020-03-08 MED ORDER — TRIAMCINOLONE ACETONIDE 0.1 % EX CREA: 1.0000 "application " | TOPICAL_CREAM | Freq: Two times a day (BID) | CUTANEOUS | 0 refills | Status: DC

## 2020-03-08 MED ORDER — DICYCLOMINE HCL 20 MG PO TABS: 20.0000 mg | ORAL_TABLET | Freq: Two times a day (BID) | ORAL | 0 refills | Status: DC | PRN

## 2020-03-08 MED ORDER — LOPERAMIDE HCL 2 MG PO CAPS: 2.0000 mg | ORAL_CAPSULE | Freq: Four times a day (QID) | ORAL | 0 refills | Status: DC | PRN

## 2020-03-08 NOTE — ED Provider Notes (Addendum)
[facility]   [id number] 03/08/20 Arrival Time: 0855  CC: ABDOMINAL PAIN  SUBJECTIVE:  Jennifer Roach is a 24 y.o. female who presents with complaint of abdominal discomfort that began gradually a few weeks ago. Reports hx IBS with diarrhea. Reports more diarrhea episodes than usual lately accompanied by abdominal cramping. Denies a precipitating event, trauma, close contacts with similar symptoms, recent travel or antibiotic use. Has previously been on prescription medications but cannot recall what they were. Has not taken OTC medications for this. Also reports eczema flare up. Has been using OTC cortisone cream with no relief. Denies alleviating or aggravating factors. Denies similar symptoms in the past. Last BM today.    Denies fever, chills, appetite changes, weight changes, nausea, vomiting, chest pain, SOB, constipation, hematochezia, melena, dysuria, difficulty urinating, increased frequency or urgency, flank pain, loss of bowel or bladder function, vaginal discharge, vaginal odor, vaginal bleeding, dyspareunia, pelvic pain.     Patient's last menstrual period was 02/09/2020.  ROS: As per HPI.  All other pertinent ROS negative.     Past Medical History:  Diagnosis Date  . IBS (irritable bowel syndrome)    No past surgical history on file. Allergies  Allergen Reactions  . No Known Allergies    No current facility-administered medications on file prior to encounter.   Current Outpatient Medications on File Prior to Encounter  Medication Sig Dispense Refill  . BLISOVI 24 FE 1-20 MG-MCG(24) tablet TAKE 1 TABLET BY MOUTH EVERY DAY 84 tablet 0  . rizatriptan (MAXALT) 5 MG tablet Take 1 tablet (5 mg total) by mouth as needed for migraine. May repeat in 2 hours if needed 10 tablet 2  . fluticasone (FLONASE) 50 MCG/ACT nasal spray Place 1 spray into both nostrils 2 (two) times daily. 48 g 2  . ketorolac (TORADOL) 10 MG tablet Take 1 tablet (10 mg total) by mouth every 6  (six) hours as needed. 20 tablet 0  . ondansetron (ZOFRAN) 4 MG tablet Take 1 tablet (4 mg total) by mouth every 8 (eight) hours as needed for nausea or vomiting. 20 tablet 2   Social History   Socioeconomic History  . Marital status: Single    Spouse name: Not on file  . Number of children: Not on file  . Years of education: Not on file  . Highest education level: Not on file  Occupational History  . Not on file  Tobacco Use  . Smoking status: Never Smoker  . Smokeless tobacco: Never Used  Substance and Sexual Activity  . Alcohol use: No    Alcohol/week: 0.0 standard drinks  . Drug use: No  . Sexual activity: Yes    Birth control/protection: OCP  Other Topics Concern  . 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 Difficulty of Paying Living Expenses: Not on file  Food Insecurity:   . Worried About Programme researcher, broadcasting/film/video in the Last Year: Not on file  . Ran Out of Food in the Last Year: Not on file  Transportation Needs:   . Lack of Transportation (Medical): Not on file  . Lack of Transportation (Non-Medical): Not on file  Physical Activity:   . Days of Exercise per Week: Not on file  . Minutes of Exercise per Session: Not on file  Stress:   . Feeling  of Stress : Not on file  Social Connections:   . Frequency of Communication with Friends and Family: Not on file  . Frequency of Social Gatherings with Friends and Family: Not on file  . Attends Religious Services: Not on file  . Active Member of Clubs or Organizations: Not on file  . Attends Banker Meetings: Not on file  . Marital Status: Not on file  Intimate Partner Violence:   . Fear of Current or Ex-Partner: Not on file  . Emotionally Abused: Not on file  . Physically Abused: Not on file  . Sexually Abused: Not on file   Family History  Problem Relation Age  of Onset  . Alcohol abuse Maternal Grandfather   . Alcohol abuse Paternal Uncle      OBJECTIVE:  Vitals:   03/08/20 0921 03/08/20 0925  BP:  137/87  Pulse:  83  Resp:  17  Temp:  98.4 F (36.9 C)  TempSrc:  Oral  SpO2:  98%  Weight: 200 lb (90.7 kg)   Height: 5\' 2"  (1.575 m)     General appearance: Alert; NAD HEENT: NCAT.  Oropharynx clear.  Lungs: clear to auscultation bilaterally without adventitious breath sounds Heart: regular rate and rhythm.  Radial pulses 2+ symmetrical bilaterally Abdomen: soft, non-distended; normal active bowel sounds; non-tender to light and deep palpation; nontender at McBurney's point; negative Murphy's sign; negative rebound; no guarding Back: no CVA tenderness Extremities: no edema; symmetrical with no gross deformities Skin: warm and dry, dry, scaly patches of erythematous skin on both arms, and lower legs Neurologic: normal gait Psychological: alert and cooperative; normal mood and affect  LABS: No results found for this or any previous visit (from the past 24 hour(s)).  DIAGNOSTIC STUDIES: No results found.   ASSESSMENT & PLAN:  1. Diarrhea, unspecified type   2. Abdominal cramping   3. Eczema, unspecified type     Meds ordered this encounter  Medications  . dicyclomine (BENTYL) 20 MG tablet    Sig: Take 1 tablet (20 mg total) by mouth 2 (two) times daily as needed for spasms.    Dispense:  20 tablet    Refill:  0    Order Specific Question:   Supervising Provider    Answer:   Merrilee Jansky  . loperamide (IMODIUM) 2 MG capsule    Sig: Take 1 capsule (2 mg total) by mouth 4 (four) times daily as needed for diarrhea or loose stools.    Dispense:  20 capsule    Refill:  0    Order Specific Question:   Supervising Provider    Answer:   [id number] Merrilee Jansky  . triamcinolone cream (KENALOG) 0.1 %    Sig: Apply 1 application topically 2 (two) times daily.    Dispense:  30 g    Refill:  0    Order Specific  Question:   Supervising Provider    Answer:   [id number] Merrilee Jansky     Get rest and drink fluids Prescribed loperamide prn Prescribed dicyclomine prn Prescribed triamcinolone for eczema   DIET Instructions:  30 minutes after taking nausea medicine, begin with sips of clear liquids. If able to hold down 2 - 4 ounces for 30 minutes, begin drinking more. Increase your fluid intake to replace losses. Clear liquids only for 24 hours (water, tea, sport drinks, clear flat ginger ale or cola and juices, broth, jello, popsicles, ect). Advance to bland foods, applesauce, rice, baked or boiled  chicken, ect. Avoid milk, greasy foods and anything that doesn't agree with you.  If you experience new or worsening symptoms return or go to ER such as fever, chills, nausea, vomiting, diarrhea, bloody or dark tarry stools, constipation, urinary symptoms, worsening abdominal discomfort, symptoms that do not improve with medications, inability to keep fluids down.  Reviewed expectations re: course of current medical issues. Questions answered. Outlined signs and symptoms indicating need for more acute intervention. Patient verbalized understanding. After Visit Summary given.   Moshe Cipro, NP 03/08/20 0945    Moshe Cipro, NP 03/08/20 [phone]

## 2020-03-08 NOTE — ED Triage Notes (Signed)
Pt states she has ibs and it has been flaring up latley.  Pt states she was on meds aprox until 5-6 months ago. She stopped taking the medication because she was not having issues at that time .  Pt reports she is also having an eczema flare.

## 2020-03-08 NOTE — Discharge Instructions (Addendum)
I have sent in loperamide for diarrhea  I have also sent in dicyclomine for abdominal cramping  I have sent in triamcinolone for  you to use for your eczema  Take these as needed  Follow up with primary care or with this office as needed

## 2020-03-09 ENCOUNTER — Other Ambulatory Visit: Payer: Self-pay | Admitting: Family Medicine

## 2020-03-09 DIAGNOSIS — Z308 Encounter for other contraceptive management: Secondary | ICD-10-CM

## 2020-03-12 NOTE — Telephone Encounter (Signed)
Pt is scheduled for a virtual f/u visit for contraceptive management/ refill

## 2020-03-13 ENCOUNTER — Encounter: Payer: Self-pay | Admitting: Family Medicine

## 2020-03-13 ENCOUNTER — Telehealth (INDEPENDENT_AMBULATORY_CARE_PROVIDER_SITE_OTHER): Payer: PRIVATE HEALTH INSURANCE | Admitting: Family Medicine

## 2020-03-13 DIAGNOSIS — L309 Dermatitis, unspecified: Secondary | ICD-10-CM | POA: Insufficient documentation

## 2020-03-13 DIAGNOSIS — Z789 Other specified health status: Secondary | ICD-10-CM | POA: Insufficient documentation

## 2020-03-13 DIAGNOSIS — Z308 Encounter for other contraceptive management: Secondary | ICD-10-CM | POA: Diagnosis not present

## 2020-03-13 MED ORDER — BLISOVI 24 FE 1-20 MG-MCG(24) PO TABS: 1.0000 | ORAL_TABLET | Freq: Every day | ORAL | 3 refills | Status: DC

## 2020-03-13 NOTE — Progress Notes (Signed)
Virtual Visit via Video Note  I connected with Jennifer Roach on 03/13/20 at  1:00 PM EDT by a video enabled telemedicine application 2/2 COVID-19 pandemic and verified that I am speaking with the correct person using two identifiers.  Location patient: home Location provider:work or home office Persons participating in the virtual visit: patient, provider  I discussed the limitations of evaluation and management by telemedicine and the availability of in person appointments. The patient expressed understanding and agreed to proceed.   HPI: Pt requesting refill on OCPs,  Denies issues with heavy bleeding, cramping.  Having regular menses.  Denies h/o blood clots, migraines, tobacco use.  Pt notes recent visit to UC for eczema flare.  Was using hydrocortisone cream which was not helping.  Typically has areas on legs but also has areas on upper extremities.  Is given triamcinolone cream which is helping.  Patient denies any recent changes in soaps, lotions, or detergents.  Social history: Patient recently moved to [place] with her bf.  Work is going well.   ROS: See pertinent positives and negatives per HPI.  Past Medical History:  Diagnosis Date  . IBS (irritable bowel syndrome)     History reviewed. No pertinent surgical history.  Family History  Problem Relation Age of Onset  . Alcohol abuse Maternal Grandfather   . Alcohol abuse Paternal Uncle       Current Outpatient Medications:  .  BLISOVI 24 FE 1-20 MG-MCG(24) tablet, TAKE 1 TABLET BY MOUTH EVERY DAY, Disp: 84 tablet, Rfl: 0 .  dicyclomine (BENTYL) 20 MG tablet, Take 1 tablet (20 mg total) by mouth 2 (two) times daily as needed for spasms., Disp: 20 tablet, Rfl: 0 .  loperamide (IMODIUM) 2 MG capsule, Take 1 capsule (2 mg total) by mouth 4 (four) times daily as needed for diarrhea or loose stools., Disp: 20 capsule, Rfl: 0 .  rizatriptan (MAXALT) 5 MG tablet, Take 1 tablet (5 mg total) by mouth as needed for migraine. May  repeat in 2 hours if needed, Disp: 10 tablet, Rfl: 2 .  triamcinolone cream (KENALOG) 0.1 %, Apply 1 application topically 2 (two) times daily., Disp: 30 g, Rfl: 0 .  fluticasone (FLONASE) 50 MCG/ACT nasal spray, Place 1 spray into both nostrils 2 (two) times daily., Disp: 48 g, Rfl: 2 .  ketorolac (TORADOL) 10 MG tablet, Take 1 tablet (10 mg total) by mouth every 6 (six) hours as needed., Disp: 20 tablet, Rfl: 0 .  ondansetron (ZOFRAN) 4 MG tablet, Take 1 tablet (4 mg total) by mouth every 8 (eight) hours as needed for nausea or vomiting., Disp: 20 tablet, Rfl: 2  EXAM:  VITALS per patient if applicable: RR between 12-20 bpm  GENERAL: alert, oriented, appears well and in no acute distress  HEENT: atraumatic, conjunctiva clear, no obvious abnormalities on inspection of external nose and ears  NECK: normal movements of the head and neck  LUNGS: on inspection no signs of respiratory distress, breathing rate appears normal, no obvious gross SOB, gasping or wheezing  CV: no obvious cyanosis  MS: moves all visible extremities without noticeable abnormality  PSYCH/NEURO: pleasant and cooperative, no obvious depression or anxiety, speech and thought processing grossly intact  ASSESSMENT AND PLAN:  Discussed the following assessment and plan:  Encounter for other contraceptive management  -Stable - Plan: Norethindrone Acetate-Ethinyl Estrad-FE (BLISOVI 24 FE) 1-20 MG-MCG(24) tablet  Eczema -Continue triamcinolone cream  Consider f/u for CPE   I discussed the assessment and treatment plan with the  patient. The patient was provided an opportunity to ask questions and all were answered. The patient agreed with the plan and demonstrated an understanding of the instructions.   The patient was advised to call back or seek an in-person evaluation if the symptoms worsen or if the condition fails to improve as anticipated.  I provided 5 minutes of non-face-to-face time during this  encounter.   Deeann Saint, MD

## 2020-04-18 ENCOUNTER — Other Ambulatory Visit: Payer: Self-pay

## 2020-04-18 ENCOUNTER — Ambulatory Visit
Admission: RE | Admit: 2020-04-18 | Discharge: 2020-04-18 | Disposition: A | Discharge status: Home / Self Care | Payer: PRIVATE HEALTH INSURANCE | Source: Ambulatory Visit | Attending: Emergency Medicine | Admitting: Emergency Medicine

## 2020-04-18 VITALS — BP 130/86 | HR 100 | Temp 98.6°F | Resp 18 | Ht 62.0 in | Wt 200.0 lb

## 2020-04-18 DIAGNOSIS — R110 Nausea: Secondary | ICD-10-CM | POA: Diagnosis not present

## 2020-04-18 DIAGNOSIS — G43909 Migraine, unspecified, not intractable, without status migrainosus: Secondary | ICD-10-CM

## 2020-04-18 HISTORY — DX: Migraine, unspecified, not intractable, without status migrainosus: G43.909

## 2020-04-18 MED ORDER — ONDANSETRON 4 MG PO TBDP: 4.0000 mg | ORAL_TABLET | Freq: Three times a day (TID) | ORAL | 0 refills | Status: DC | PRN

## 2020-04-18 NOTE — ED Provider Notes (Signed)
[facility]   [id number] 04/18/20 Arrival Time: 1239  CW:[id number]  SUBJECTIVE:  Jennifer Roach is a 24 y.o. female presented to the urgent care with a complaint of migraine for the past 2 to 3 days.  Reports she has been nauseated as well..  Patient localizes her pain to the generalized head.  Describes the pain as constant and throbbing in character.  Patient has tried Maxalt for headache with relief.  Unable to control nausea.  Symptoms are made worse with light.  Reports similar symptoms in the past.  This is not the worst headache of their life.  Patient denies fever, chills, nausea, vomiting, aura, rhinorrhea, watery eyes, chest pain, SOB, abdominal pain, weakness, numbness or tingling, slurred speech.     ROS: As per HPI.  All other pertinent ROS negative.     Past Medical History:  Diagnosis Date   IBS (irritable bowel syndrome)    Migraine    History reviewed. No pertinent surgical history. Allergies  Allergen Reactions   No Known Allergies    No current facility-administered medications on file prior to encounter.   Current Outpatient Medications on File Prior to Encounter  Medication Sig Dispense Refill   dicyclomine (BENTYL) 20 MG tablet Take 1 tablet (20 mg total) by mouth 2 (two) times daily as needed for spasms. 20 tablet 0   fluticasone (FLONASE) 50 MCG/ACT nasal spray Place 1 spray into both nostrils 2 (two) times daily. 48 g 2   ketorolac (TORADOL) 10 MG tablet Take 1 tablet (10 mg total) by mouth every 6 (six) hours as needed. 20 tablet 0   loperamide (IMODIUM) 2 MG capsule Take 1 capsule (2 mg total) by mouth 4 (four) times daily as needed for diarrhea or loose stools. 20 capsule 0   Norethindrone Acetate-Ethinyl Estrad-FE (BLISOVI 24 FE) 1-20 MG-MCG(24) tablet Take 1 tablet by mouth daily. 84 tablet 3   rizatriptan (MAXALT) 5 MG tablet Take 1 tablet (5 mg total) by mouth as needed for migraine. May repeat in 2 hours if needed 10 tablet 2    triamcinolone cream (KENALOG) 0.1 % Apply 1 application topically 2 (two) times daily. 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   One sister who is 38   Social Determinants of Corporate investment banker Strain:    Difficulty of Paying Living Expenses: Not on file  Food Insecurity:    Worried About Programme researcher, broadcasting/film/video in the Last Year: Not on file   [employer] of Food in the Last Year: Not on file  Transportation Needs:    Lack of Transportation (Medical): Not on file   Lack of Transportation (Non-Medical): Not on file  Physical Activity:    Days of Exercise per Week: Not on file   Minutes of  Exercise per Session: Not on file  Stress:    Feeling of Stress : Not on file  Social Connections:    Frequency of Communication with Friends and Family: Not on file   Frequency of Social Gatherings with Friends and Family: Not on file   Attends Religious Services: Not on file   Active Member of Clubs or Organizations: Not on file   Attends Banker Meetings: Not on file   Marital Status: Not on file  Intimate Partner Violence:    Fear of Current or Ex-Partner: Not on file   Emotionally Abused: Not on file   Physically Abused: Not on file   Sexually Abused: Not on file   Family History  Problem Relation Age of Onset   Alcohol abuse Maternal Grandfather    Alcohol abuse Paternal Uncle      OBJECTIVE:  Vitals:   04/18/20 1326 04/18/20 1327  BP: 130/86   Pulse: 100   Resp: 18   Temp: 98.6 F (37 C)   TempSrc: Oral   SpO2: 98%   Weight:  200 lb (90.7 kg)  Height:  5\' 2"  (1.575 m)    General appearance: alert; no distress Eyes: PERRLA; EOMI HENT: normocephalic; atraumatic Neck: supple with FROM Lungs: clear to auscultation bilaterally Heart: regular rate and rhythm.  Radial pulses 2+ symmetrical bilaterally Extremities: no edema; symmetrical with no gross deformities Skin: warm and dry Neurologic: CN 2-12 grossly intact; finger to nose without difficulty; normal gait; strength and sensation intact bilaterally about the upper and lower extremities; negative pronator drift Psychological: alert and cooperative; normal mood and affect   ASSESSMENT & PLAN:  1. Migraine without status migrainosus, not intractable, unspecified migraine type   2. Nausea without vomiting     Meds ordered this encounter  Medications   ondansetron (ZOFRAN ODT) 4 MG disintegrating tablet    Sig: Take 1 tablet (4 mg total) by mouth every 8 (eight) hours as needed for nausea or vomiting.    Dispense:  30 tablet    Refill:  0    Discharge instructions  Rest and drink plenty of fluids Continue to take Maxalt as prescribed Zofran was prescribed/take as directed Follow up with PCP if symptoms persists Return or go to the ER if you have any new or worsening symptoms such as fever, chills, nausea, vomiting, chest pain, shortness of breath, cough, vision changes, worsening headache despite treatment, slurred speech, facial asymmetry, weakness in arms or legs, etc...  Reviewed expectations re: course of current medical issues. Questions answered. Outlined signs and symptoms indicating need for more acute intervention. Patient verbalized understanding. After Visit Summary given. , FNP 04/18/20 1406

## 2020-04-18 NOTE — ED Triage Notes (Signed)
Pt has had migraine x 2-3 days states she has been nauseated and does not have any medication for nausea.  Pt also reports she is having irregular menstrual cycles x 2 months, she has been having 2 cycles per month.

## 2020-04-18 NOTE — Discharge Instructions (Addendum)
  Rest and drink plenty of fluids Continue to take Maxalt as prescribed Zofran was prescribed/take as directed Follow up with PCP if symptoms persists Return or go to the ER if you have any new or worsening symptoms such as fever, chills, nausea, vomiting, chest pain, shortness of breath, cough, vision changes, worsening headache despite treatment, slurred speech, facial asymmetry, weakness in arms or legs, etc..Marland Kitchen

## 2020-06-17 ENCOUNTER — Other Ambulatory Visit: Payer: Self-pay

## 2020-06-17 ENCOUNTER — Ambulatory Visit
Admission: RE | Admit: 2020-06-17 | Discharge: 2020-06-17 | Disposition: A | Discharge status: Home / Self Care | Payer: PRIVATE HEALTH INSURANCE | Source: Ambulatory Visit | Attending: Emergency Medicine | Admitting: Emergency Medicine

## 2020-06-17 ENCOUNTER — Ambulatory Visit (INDEPENDENT_AMBULATORY_CARE_PROVIDER_SITE_OTHER): Payer: PRIVATE HEALTH INSURANCE

## 2020-06-17 VITALS — BP 142/83 | HR 115 | Temp 98.2°F | Resp 18

## 2020-06-17 DIAGNOSIS — M25472 Effusion, left ankle: Secondary | ICD-10-CM

## 2020-06-17 DIAGNOSIS — M25572 Pain in left ankle and joints of left foot: Secondary | ICD-10-CM

## 2020-06-17 MED ORDER — IBUPROFEN 800 MG PO TABS: 800.0000 mg | ORAL_TABLET | Freq: Three times a day (TID) | ORAL | 0 refills | Status: DC | PRN

## 2020-06-17 NOTE — ED Triage Notes (Signed)
Pt presents with left ankle injury after stepping in hole last night

## 2020-06-17 NOTE — Discharge Instructions (Addendum)
Take ibuprofen with food as needed for pain Follow-up with orthopedic/PCP for further evaluation with an MRI or CT to rule out ligament tear Follow RICE instructions as attached Return or go to ED if you develop any new or worsening of symptoms

## 2020-06-17 NOTE — ED Provider Notes (Signed)
[facility]   [id number] 06/17/20 Arrival Time: 1202   Chief Complaint  Patient presents with   Ankle Injury     SUBJECTIVE: History from: patient.  Jennifer Roach is a 24 y.o. female presented to the urgent care with a complaint of left ankle pain for the past 1 day. Developed the symptom after stepping in a hole and twisting her ankle.. She localizes the pain to the left ankle. She describes the pain as constant and achy. She has tried OTC medications without relief. Her symptoms are made worse with ROM. She denies similar symptoms in the past. Denies chills, fever, nausea, vomiting, diarrhea.  ROS: As per HPI.  All other pertinent ROS negative.      Past Medical History:  Diagnosis Date   IBS (irritable bowel syndrome)    Migraine    History reviewed. No pertinent surgical history. Allergies  Allergen Reactions   No Known Allergies    No current facility-administered medications on file prior to encounter.   Current Outpatient Medications on File Prior to Encounter  Medication Sig Dispense Refill   dicyclomine (BENTYL) 20 MG tablet Take 1 tablet (20 mg total) by mouth 2 (two) times daily as needed for spasms. 20 tablet 0   fluticasone (FLONASE) 50 MCG/ACT nasal spray Place 1 spray into both nostrils 2 (two) times daily. 48 g 2   ketorolac (TORADOL) 10 MG tablet Take 1 tablet (10 mg total) by mouth every 6 (six) hours as needed. 20 tablet 0   loperamide (IMODIUM) 2 MG capsule Take 1 capsule (2 mg total) by mouth 4 (four) times daily as needed for diarrhea or loose stools. 20 capsule 0   Norethindrone Acetate-Ethinyl Estrad-FE (BLISOVI 24 FE) 1-20 MG-MCG(24) tablet Take 1 tablet by mouth daily. 84 tablet 3   ondansetron (ZOFRAN ODT) 4 MG disintegrating tablet Take 1 tablet (4 mg total) by mouth every 8 (eight) hours as needed for nausea or vomiting. 30 tablet 0   rizatriptan (MAXALT) 5 MG tablet Take 1 tablet (5 mg total) by mouth as needed for  migraine. May repeat in 2 hours if needed 10 tablet 2   triamcinolone cream (KENALOG) 0.1 % Apply 1 application topically 2 (two) times daily. 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   One sister who is 23   Social Determinants of Corporate investment banker Strain:    Difficulty of Paying Living Expenses: Not on file  Food Insecurity:    Worried About Programme researcher, broadcasting/film/video in the Last Year: Not on file   [employer] of Food in the Last Year: Not on file  Transportation Needs:    Lack of Transportation (Medical): Not on file   Lack of Transportation (Non-Medical): Not on file  Physical Activity:  Days of Exercise per Week: Not on file   Minutes of Exercise per Session: Not on file  Stress:    Feeling of Stress : Not on file  Social Connections:    Frequency of Communication with Friends and Family: Not on file   Frequency of Social Gatherings with Friends and Family: Not on file   Attends Religious Services: Not on file   Active Member of Clubs or Organizations: Not on file   Attends Banker Meetings: Not on file   Marital Status: Not on file  Intimate Partner Violence:    Fear of Current or Ex-Partner: Not on file   Emotionally Abused: Not on file   Physically Abused: Not on file   Sexually Abused: Not on file   Family History  Problem Relation Age of Onset   Alcohol  abuse Maternal Grandfather    Alcohol abuse Paternal Uncle     OBJECTIVE:  Vitals:   06/17/20 1234  BP: (!) 142/83  Pulse: (!) 115  Resp: 18  Temp: 98.2 F (36.8 C)  SpO2: 98%     Physical Exam Vitals and nursing note reviewed.  Constitutional:      General: She is not in acute distress.    Appearance: Normal appearance. She is normal weight. She is not ill-appearing, toxic-appearing or diaphoretic.  HENT:     Head: Normocephalic.  Cardiovascular:     Rate and Rhythm: Normal rate and regular rhythm.     Pulses: Normal pulses.     Heart sounds: Normal heart sounds. No murmur heard.  No friction rub. No gallop.   Pulmonary:     Effort: Pulmonary effort is normal. No respiratory distress.     Breath sounds: Normal breath sounds. No stridor. No wheezing, rhonchi or rales.  Chest:     Chest wall: No tenderness.  Musculoskeletal:        General: Tenderness present.     Right ankle: Normal.     Left ankle: Swelling present. Tenderness present.     Comments: Left ankle is with obvious deformity when compared to the right ankle. Swelling and tenderness are present. There is no ecchymosis, open wound, surface trauma, lesion or warmth present. Limited range of motion due to pain. Neurovascular status intact.  Neurological:     Mental Status: She is alert and oriented to person, place, and time.     LABS:  No results found for this or any previous visit (from the past 24 hour(s)).   RADIOLOGY:  DG Ankle Complete Left  Result Date: 06/17/2020 CLINICAL DATA:  Pain following fall EXAM: LEFT ANKLE COMPLETE - 3+ VIEW COMPARISON:  None. FINDINGS: Frontal, oblique, and lateral views were obtained. There is soft tissue swelling laterally with small joint effusion. No fracture evident. No joint space narrowing or erosion. Ankle mortise appears intact. IMPRESSION: Soft tissue swelling laterally with small joint effusion. Suspect underlying ligamentous injury. No fracture. Ankle mortise  appears intact. No appreciable underlying arthropathic change. Electronically Signed   By: Bretta Bang III M.D.   On: 06/17/2020 13:04   Left ankle x-ray is negative for bony abnormality including fracture or dislocation. There is a suspicious for ligament injury. Further MRI or CT may be recommended. I have reviewed the x-ray myself and the radiologist interpretation.  I am in agreement with the radiologist interpretation.  ASSESSMENT & PLAN:  1. Acute left ankle pain   2. Ankle swelling, left     No orders of the defined types were  placed in this encounter.   Discharge instructions  Take ibuprofen with food as needed for pain Follow-up with orthopedic/PCP for further evaluation with an MRI or CT to rule out ligament tear Follow RICE instructions as attached Return or go to ED if you develop any new or worsening of symptoms  Reviewed expectations re: course of current medical issues. Questions answered. Outlined signs and symptoms indicating need for more acute intervention. Patient verbalized understanding. After Visit Summary given.         Durward Parcel, FNP 06/17/20 1325

## 2020-06-19 ENCOUNTER — Ambulatory Visit: Payer: PRIVATE HEALTH INSURANCE | Admitting: Family Medicine

## 2021-01-07 ENCOUNTER — Telehealth: Payer: PRIVATE HEALTH INSURANCE | Admitting: Physician Assistant

## 2021-01-07 DIAGNOSIS — R197 Diarrhea, unspecified: Secondary | ICD-10-CM

## 2021-01-07 DIAGNOSIS — R112 Nausea with vomiting, unspecified: Secondary | ICD-10-CM

## 2021-01-07 DIAGNOSIS — R42 Dizziness and giddiness: Secondary | ICD-10-CM

## 2021-01-07 NOTE — Progress Notes (Signed)
Based on what you shared with me, I feel your condition warrants further evaluation and I recommend that you be seen in a face to face visit. Giving dizziness associated with combination of diarrhea and vomiting and concern for dehydration, you need to be evaluated in person. If your PCP is unable to see you today, please be seen at nearest Urgent Care facility.    NOTE: There will be NO CHARGE for this eVisit   If you are having a true medical emergency please call 911.      For an urgent face to face visit, [facility] has six urgent care centers for your convenience:     [facility] Urgent [facility] Center at [facility] Directions [phone] [address] [place], [place] [address]    [facility] Urgent Care Center [facility]) Get Driving Directions [phone] [address] [place], [place] [address]  [facility] Urgent Care Center [facility]) Get Driving Directions [phone] [address] [place],  [place]  [address]  [facility] Urgent Care at [facility] Get Driving Directions [phone] [address], [address] [place], [place] [address]   [facility] Urgent Care at [facility] Get Driving Directions  [phone] [address] [place], [place] [address]   [facility] Urgent Care at [facility] Directions [phone] [address], [address] [place], [place] [address]  Your MyChart E-visit questionnaire answers were reviewed by a board certified advanced clinical practitioner to complete your personal care plan based on your specific symptoms.  Thank you for using e-Visits.

## 2021-02-24 ENCOUNTER — Other Ambulatory Visit: Payer: Self-pay | Admitting: Family Medicine

## 2021-02-24 DIAGNOSIS — Z308 Encounter for other contraceptive management: Secondary | ICD-10-CM

## 2021-05-14 ENCOUNTER — Telehealth: Payer: PRIVATE HEALTH INSURANCE | Admitting: Physician Assistant

## 2021-05-14 ENCOUNTER — Other Ambulatory Visit: Payer: Self-pay | Admitting: Family Medicine

## 2021-05-14 ENCOUNTER — Telehealth: Payer: No Typology Code available for payment source | Admitting: Physician Assistant

## 2021-05-14 DIAGNOSIS — Z308 Encounter for other contraceptive management: Secondary | ICD-10-CM

## 2021-05-14 DIAGNOSIS — G43809 Other migraine, not intractable, without status migrainosus: Secondary | ICD-10-CM

## 2021-05-14 DIAGNOSIS — R112 Nausea with vomiting, unspecified: Secondary | ICD-10-CM

## 2021-05-14 DIAGNOSIS — G43909 Migraine, unspecified, not intractable, without status migrainosus: Secondary | ICD-10-CM | POA: Diagnosis not present

## 2021-05-14 MED ORDER — ONDANSETRON 4 MG PO TBDP: 4.0000 mg | ORAL_TABLET | Freq: Three times a day (TID) | ORAL | 0 refills | Status: DC | PRN

## 2021-05-14 MED ORDER — RIZATRIPTAN BENZOATE 10 MG PO TABS: 10.0000 mg | ORAL_TABLET | Freq: Once | ORAL | 0 refills | Status: DC | PRN

## 2021-05-14 NOTE — Progress Notes (Signed)
Based on the information that you have shared in the e-Visit Questionnaire, we recommend that you convert this visit to a video visit in order for the provider to better assess what is going on.  The provider will be able to give you a more accurate diagnosis and treatment plan if we can more freely discuss your symptoms and with the addition of a virtual examination.   We cannot treat migraines via e-visit so in order to get both the migraine under control and start treatment for the symptoms of nausea and vomiting from the migraine, you will need to complete a video this morning.   If you convert to a video visit, we will bill your insurance (similar to an office visit) and you will not be charged for this e-Visit. You will be able to stay at home and speak with the first available [facility] advanced practice provider. The link to do a video visit is in the drop down Menu tab of your Welcome screen in MyChart.

## 2021-05-14 NOTE — Progress Notes (Signed)
Virtual Visit Consent   Jennifer Roach, you are scheduled for a virtual visit with a [facility] provider today.     Just as with appointments in the office, your consent must be obtained to participate.  Your consent will be active for this visit and any virtual visit you may have with one of our providers in the next 365 days.     If you have a MyChart account, a copy of this consent can be sent to you electronically.  All virtual visits are billed to your insurance company just like a traditional visit in the office.    As this is a virtual visit, video technology does not allow for your provider to perform a traditional examination.  This may limit your provider's ability to fully assess your condition.  If your provider identifies any concerns that need to be evaluated in person or the need to arrange testing (such as labs, EKG, etc.), we will make arrangements to do so.     Although advances in technology are sophisticated, we cannot ensure that it will always work on either your end or our end.  If the connection with a video visit is poor, the visit may have to be switched to a telephone visit.  With either a video or telephone visit, we are not always able to ensure that we have a secure connection.     I need to obtain your verbal consent now.   Are you willing to proceed with your visit today?    Jennifer Roach has provided verbal consent on 05/14/2021 for a virtual visit (video or telephone).   Piedad Climes, [place]   Date: 05/14/2021 8:08 AM   Virtual Visit via Video Note   I, Piedad Climes, connected with  Jennifer Roach  ([id number], 1995-11-23) on 05/14/21 at  7:45 AM EDT by a video-enabled telemedicine application and verified that I am speaking with the correct person using two identifiers.  Location: Patient: Virtual Visit Location Patient: Home Provider: Virtual Visit Location Provider: Home Office   I discussed the limitations of evaluation and management by  telemedicine and the availability of in person appointments. The patient expressed understanding and agreed to proceed.    History of Present Illness: Jennifer Roach is a 25 y.o. who identifies as a female who was assigned female at birth, and is being seen today for acute migraine headache, right-sided x 2.5 days. Is associated with some nausea and non-bloody emesis. Aura initially which is normal for her. Also associated with photophobia. Denies fevers, chills, aches.  Has taken Excedrin and Acetaminophen.   HPI: HPI  Problems:  Patient Active Problem List   Diagnosis Date Noted   Eczema 03/13/2020   Uses birth control 03/13/2020   IBS (irritable bowel syndrome) 06/19/2019    Allergies:  Allergies  Allergen Reactions   No Known Allergies    Medications:  Current Outpatient Medications:    ondansetron (ZOFRAN ODT) 4 MG disintegrating tablet, Take 1 tablet (4 mg total) by mouth every 8 (eight) hours as needed for nausea or vomiting., Disp: 20 tablet, Rfl: 0   rizatriptan (MAXALT) 10 MG tablet, Take 1 tablet (10 mg total) by mouth once as needed for up to 1 dose for migraine. May repeat in 2 hours if needed, Disp: 10 tablet, Rfl: 0   [name] 24 FE 1-20 MG-MCG(24) tablet, TAKE 1 TABLET BY MOUTH EVERY DAY, Disp: 84 tablet, Rfl: 0  Observations/Objective: Patient is well-developed, well-nourished in  no acute distress.  Resting comfortably at home.  Head is normocephalic, atraumatic.  No labored breathing. Speech is clear and coherent with logical content.  Patient is alert and oriented at baseline.   Assessment and Plan: 1. Acute migraine - rizatriptan (MAXALT) 10 MG tablet; Take 1 tablet (10 mg total) by mouth once as needed for up to 1 dose for migraine. May repeat in 2 hours if needed  Dispense: 10 tablet; Refill: 0 - ondansetron (ZOFRAN ODT) 4 MG disintegrating tablet; Take 1 tablet (4 mg total) by mouth every 8 (eight) hours as needed for nausea or vomiting.  Dispense: 20 tablet;  Refill: 0  2. Nausea and vomiting, unspecified vomiting type - ondansetron (ZOFRAN ODT) 4 MG disintegrating tablet; Take 1 tablet (4 mg total) by mouth every 8 (eight) hours as needed for nausea or vomiting.  Dispense: 20 tablet; Refill: 0 Rx Maxalt 10 mg for abortive therapy. Supportive measures and OTC medications reviewed. Will give Rx zofran for nausea. Strict in-person evaluation precautions discussed. ER precautions reviewed. She is to schedule follow-up with her PCP about discussing neurology referral giving ongoing migraines.  Follow Up Instructions: I discussed the assessment and treatment plan with the patient. The patient was provided an opportunity to ask questions and all were answered. The patient agreed with the plan and demonstrated an understanding of the instructions.  A copy of instructions were sent to the patient via MyChart unless otherwise noted below.    The patient was advised to call back or seek an in-person evaluation if the symptoms worsen or if the condition fails to improve as anticipated.  Time:  I spent 12 minutes with the patient via telehealth technology discussing the above problems/concerns.    Piedad Climes, PA-C

## 2021-05-14 NOTE — Patient Instructions (Signed)
  Lisabeth Register, thank you for joining Piedad Climes, PA-C for today's virtual visit.  While this provider is not your primary care provider (PCP), if your PCP is located in our provider database this encounter information will be shared with them immediately following your visit.  Consent: (Patient) Lisabeth Register provided verbal consent for this virtual visit at the beginning of the encounter.  Current Medications:  Current Outpatient Medications:    ondansetron (ZOFRAN ODT) 4 MG disintegrating tablet, Take 1 tablet (4 mg total) by mouth every 8 (eight) hours as needed for nausea or vomiting., Disp: 20 tablet, Rfl: 0   rizatriptan (MAXALT) 10 MG tablet, Take 1 tablet (10 mg total) by mouth once as needed for up to 1 dose for migraine. May repeat in 2 hours if needed, Disp: 10 tablet, Rfl: 0   [name] 24 FE 1-20 MG-MCG(24) tablet, TAKE 1 TABLET BY MOUTH EVERY DAY, Disp: 84 tablet, Rfl: 0   Medications ordered in this encounter:  Meds ordered this encounter  Medications   rizatriptan (MAXALT) 10 MG tablet    Sig: Take 1 tablet (10 mg total) by mouth once as needed for up to 1 dose for migraine. May repeat in 2 hours if needed    Dispense:  10 tablet    Refill:  0    Order Specific Question:   Supervising Provider    Answer:   MILLER, BRIAN [[id number]]   ondansetron (ZOFRAN ODT) 4 MG disintegrating tablet    Sig: Take 1 tablet (4 mg total) by mouth every 8 (eight) hours as needed for nausea or vomiting.    Dispense:  20 tablet    Refill:  0    Order Specific Question:   Supervising Provider    Answer:   Hyacinth Meeker, BRIAN [[id number]]     *If you need refills on other medications prior to your next appointment, please contact your pharmacy*  Follow-Up: Call back or seek an in-person evaluation if the symptoms worsen or if the condition fails to improve as anticipated.  Other Instructions Please keep well hydrated and try to rest Take the Maxalt as directed to abort the headache. Can use OTC  Tylenol if needed later in the day. The Zofran will help with any residual nausea from the migraine. Ease back in to your regular diet once able.  Follow-up with your PCP regarding recurring migraines. They may need to set you up with a Neurologist.   If headache is not resolving within 24 hours or you note any new or worsening symptoms, you need to be seen in person at UC/ER ASAP. Do not delay care.    If you have been instructed to have an in-person evaluation today at a local Urgent Care facility, please use the link below. It will take you to a list of all of our available [facility], including address, phone number and hours of operation. Please do not delay care.  [facility]  If you or a family member do not have a primary care provider, use the link below to schedule a visit and establish care. When you choose a [facility] primary care physician or advanced practice provider, you gain a long-term partner in health. Find a Primary Care Provider  Learn more about [facility] in-office and virtual care options:  - Get Care Now

## 2021-05-30 ENCOUNTER — Telehealth: Payer: Self-pay | Admitting: Family Medicine

## 2021-05-30 ENCOUNTER — Other Ambulatory Visit: Payer: Self-pay | Admitting: Family Medicine

## 2021-05-30 DIAGNOSIS — Z308 Encounter for other contraceptive management: Secondary | ICD-10-CM

## 2021-05-30 NOTE — Telephone Encounter (Signed)
Patient called to see if she can get temporary refill on [name] 24 FE 1-20 MG-MCG(24) tablet Patient does have appointment scheduled for 11/16 for in person but will run out tomorrow      Please send to  [id number] [place], [place] - [address] AT [facility] [address] Phone:  [phone]  Fax:  [phone]           Please advise

## 2021-06-04 ENCOUNTER — Encounter: Payer: Self-pay | Admitting: Family Medicine

## 2021-06-04 ENCOUNTER — Ambulatory Visit: Payer: No Typology Code available for payment source | Admitting: Family Medicine

## 2021-06-04 VITALS — BP 150/98 | HR 121 | Temp 97.9°F | Ht 62.21 in | Wt 244.0 lb

## 2021-06-04 DIAGNOSIS — R635 Abnormal weight gain: Secondary | ICD-10-CM | POA: Diagnosis not present

## 2021-06-04 DIAGNOSIS — R030 Elevated blood-pressure reading, without diagnosis of hypertension: Secondary | ICD-10-CM | POA: Diagnosis not present

## 2021-06-04 DIAGNOSIS — R000 Tachycardia, unspecified: Secondary | ICD-10-CM

## 2021-06-04 DIAGNOSIS — R002 Palpitations: Secondary | ICD-10-CM

## 2021-06-04 LAB — CBC WITH DIFFERENTIAL/PLATELET
Basophils Absolute: 0 10*3/uL (ref 0.0–0.1)
Basophils Relative: 0.5 % (ref 0.0–3.0)
Eosinophils Absolute: 0.2 10*3/uL (ref 0.0–0.7)
Eosinophils Relative: 1.9 % (ref 0.0–5.0)
HCT: 40.7 % (ref 36.0–46.0)
Hemoglobin: 13.3 g/dL (ref 12.0–15.0)
Lymphocytes Relative: 26.8 % (ref 12.0–46.0)
Lymphs Abs: 2.4 10*3/uL (ref 0.7–4.0)
MCHC: 32.8 g/dL (ref 30.0–36.0)
MCV: 79.8 fl (ref 78.0–100.0)
Monocytes Absolute: 0.6 10*3/uL (ref 0.1–1.0)
Monocytes Relative: 7 % (ref 3.0–12.0)
Neutro Abs: 5.8 10*3/uL (ref 1.4–7.7)
Neutrophils Relative %: 63.8 % (ref 43.0–77.0)
Platelets: 339 10*3/uL (ref 150.0–400.0)
RBC: 5.11 Mil/uL (ref 3.87–5.11)
RDW: 13.6 % (ref 11.5–15.5)
WBC: 9.1 10*3/uL (ref 4.0–10.5)

## 2021-06-04 LAB — BASIC METABOLIC PANEL
BUN: 12 mg/dL (ref 6–23)
CO2: 23 mEq/L (ref 19–32)
Calcium: 9.9 mg/dL (ref 8.4–10.5)
Chloride: 104 mEq/L (ref 96–112)
Creatinine, Ser: 0.7 mg/dL (ref 0.40–1.20)
GFR: 121.66 mL/min (ref >60.00)
Glucose, Bld: 83 mg/dL (ref 70–99)
Potassium: 3.9 mEq/L (ref 3.5–5.1)
Sodium: 139 mEq/L (ref 135–145)

## 2021-06-04 LAB — HEMOGLOBIN A1C: Hgb A1c MFr Bld: 5.7 % (ref 4.6–6.5)

## 2021-06-04 LAB — TSH: TSH: 2.39 u[IU]/mL (ref 0.35–5.50)

## 2021-06-04 LAB — T4, FREE: Free T4: 0.74 ng/dL (ref 0.60–1.60)

## 2021-06-04 NOTE — Progress Notes (Signed)
Subjective:    Patient ID: Jennifer Roach, female    DOB: 27-Feb-1996, 25 y.o.   MRN: [id number]  Chief Complaint  Patient presents with   Medication Refill    Birth control    HPI Patient was seen today for medication refill.  Pt out of OCPs.  Denies any CP, SOB, palpitations, LE edema or calf pain.  Pt states things are good.  In school and got a raise at work.  Pt plans to schedule an appt with OB/Gyn for pap as has never had one.  Pt endorses ~50 lb weight gain since last OFV.  States has lost 5 or 6 lbs.  Did not realize bp was elevated.  Had coffee this am.  Past Medical History:  Diagnosis Date   IBS (irritable bowel syndrome)    Migraine     Allergies  Allergen Reactions   No Known Allergies     ROS General: Denies fever, chills, night sweats, changes in weight, changes in appetite +weight gain HEENT: Denies headaches, ear pain, changes in vision, rhinorrhea, sore throat CV: Denies CP, palpitations, SOB, orthopnea Pulm: Denies SOB, cough, wheezing GI: Denies abdominal pain, nausea, vomiting, diarrhea, constipation GU: Denies dysuria, hematuria, frequency, vaginal discharge Msk: Denies muscle cramps, joint pains Neuro: Denies weakness, numbness, tingling Skin: Denies rashes, bruising Psych: Denies depression, anxiety, hallucinations     Objective:    Blood pressure (!) 150/98, pulse (!) 121, temperature 97.9 F (36.6 C), temperature source Oral, height 5' 2.21" (1.58 m), weight 244 lb (110.7 kg), SpO2 95 %. Body mass index is 44.33 kg/m.  Gen. Pleasant, well-nourished, in no distress, normal affect   HEENT: [place]/AT, face symmetric, conjunctiva clear, no scleral icterus, PERRLA, EOMI, nares patent without drainage Lungs: no accessory muscle use, CTAB, no wheezes or rales Cardiovascular: RRR, no m/r/g, no peripheral edema Musculoskeletal: No deformities, no cyanosis or clubbing, normal tone Neuro:  A&Ox3, CN II-XII intact, normal gait Skin:  Warm, no lesions/  rash   Wt Readings from Last 3 Encounters:  06/04/21 244 lb (110.7 kg)  04/18/20 200 lb (90.7 kg)  03/08/20 200 lb (90.7 kg)    Lab Results  Component Value Date   WBC 10.2 12/29/2006   HGB 13.4 12/29/2006   HCT 39.4 12/29/2006   PLT 282 12/29/2006   GLUCOSE 91 12/29/2006   ALT 28 12/29/2006   AST 36 12/29/2006   NA 134 (L) 12/29/2006   K 4.1 12/29/2006   CL 102 12/29/2006   CREATININE 0.51 12/29/2006   BUN 12 12/29/2006   CO2 25 12/29/2006    Assessment/Plan:  Elevated blood-pressure reading without diagnosis of hypertension  -150/98 -recheck -previously 124/80 in clinic, though a few yrs ago -lifestyle modifications strongly encouraged -will obtain labs -EKG sinus tachycardia, VR 107, PR interval 0.12. R and R' waves in III, smaller ones in V1 and V2.  Consisder RBBB. No previous studies for comparison. -pt encouraged to obtain bp cuff to monitor at home -f/u in 1 month.  For continued elevation start medication - Plan: EKG 12-Lead, Basic metabolic panel  Weight gain  -BMI 44.335 -Lifestyle modification strongly encouraged -Given handout - Plan: TSH, T4, Free, Hemoglobin A1c  Palpitations -Asymptomatic - Plan: EKG 12-Lead, CBC with Differential/Platelet, TSH, T4, Free, Basic metabolic panel, Hemoglobin A1c  Tachycardia -Discussed obtaining labs -Plan: EKG  F/u in 1 month, sooner if needed  Abbe Amsterdam, MD

## 2021-06-09 NOTE — Telephone Encounter (Signed)
Matter addressed during OFV.

## 2021-06-10 ENCOUNTER — Other Ambulatory Visit: Payer: Self-pay | Admitting: Family Medicine

## 2021-06-10 ENCOUNTER — Other Ambulatory Visit: Payer: Self-pay

## 2021-06-10 DIAGNOSIS — Z308 Encounter for other contraceptive management: Secondary | ICD-10-CM

## 2021-06-10 NOTE — Progress Notes (Signed)
Patient called asking for Rx refill patient stated she will be traveling and would like rx sent to pharmacy today Jennifer Roach 24 FE 1-20 MG-MCG(24) tablet

## 2021-07-30 ENCOUNTER — Telehealth: Payer: No Typology Code available for payment source | Admitting: Nurse Practitioner

## 2021-07-30 DIAGNOSIS — R112 Nausea with vomiting, unspecified: Secondary | ICD-10-CM

## 2021-07-30 MED ORDER — ONDANSETRON HCL 4 MG PO TABS: 4.0000 mg | ORAL_TABLET | Freq: Three times a day (TID) | ORAL | 0 refills | Status: DC | PRN

## 2021-07-30 NOTE — Progress Notes (Signed)
E-Visit for Nausea and Vomiting   We are sorry that you are not feeling well. Here is how we plan to help!  Based on what you have shared with me it looks like you have a Virus that is irritating your GI tract.  Vomiting is the forceful emptying of a portion of the stomach's content through the mouth.  Although nausea and vomiting can make you feel miserable, it's important to remember that these are not diseases, but rather symptoms of an underlying illness.  When we treat short term symptoms, we always caution that any symptoms that persist should be fully evaluated in a medical office.  I have prescribed a medication that will help alleviate your symptoms and allow you to stay hydrated:  Zofran 4 mg 1 tablet every 8 hours as needed for nausea and vomiting  HOME CARE: Drink clear liquids.  This is very important! Dehydration (the lack of fluid) can lead to a serious complication.  Start off with 1 tablespoon every 5 minutes for 8 hours. You may begin eating bland foods after 8 hours without vomiting.  Start with saltine crackers, white bread, rice, mashed potatoes, applesauce. After 48 hours on a bland diet, you may resume a normal diet. Try to go to sleep.  Sleep often empties the stomach and relieves the need to vomit.  GET HELP RIGHT AWAY IF:  Your symptoms do not improve or worsen within 2 days after treatment. You have a fever for over 3 days. You cannot keep down fluids after trying the medication.  MAKE SURE YOU:  Understand these instructions. Will watch your condition. Will get help right away if you are not doing well or get worse.    Thank you for choosing an e-visit.  Your e-visit answers were reviewed by a board certified advanced clinical practitioner to complete your personal care plan. Depending upon the condition, your plan could have included both over the counter or prescription medications.  Please review your pharmacy choice. Make sure the pharmacy is open so  you can pick up prescription now. If there is a problem, you may contact your provider through MyChart messaging and have the prescription routed to another pharmacy.  Your safety is important to us. If you have drug allergies check your prescription carefully.   For the next 24 hours you can use MyChart to ask questions about today's visit, request a non-urgent call back, or ask for a work or school excuse. You will get an email in the next two days asking about your experience. I hope that your e-visit has been valuable and will speed your recovery.  5-10 minutes spent reviewing and documenting in chart.  

## 2021-08-29 ENCOUNTER — Other Ambulatory Visit: Payer: Self-pay | Admitting: Family Medicine

## 2021-08-29 DIAGNOSIS — Z308 Encounter for other contraceptive management: Secondary | ICD-10-CM

## 2021-08-29 NOTE — Telephone Encounter (Addendum)
Last OV 06/04/21 for elevated BP & tachycardia. Recommendation by PCP at that time was to f/u in 54mo. No future appt noted.  LVM instructions for pt to call office to schedule f/u appt.

## 2021-08-29 NOTE — Telephone Encounter (Signed)
Patient needs follow up appointment for future refills.

## 2021-08-29 NOTE — Telephone Encounter (Signed)
LVM instructions for pt to schedule f/u appt for future medication refills.

## 2021-11-22 ENCOUNTER — Other Ambulatory Visit: Payer: Self-pay | Admitting: Family Medicine

## 2021-11-22 DIAGNOSIS — Z308 Encounter for other contraceptive management: Secondary | ICD-10-CM

## 2021-12-15 ENCOUNTER — Other Ambulatory Visit: Payer: Self-pay | Admitting: Family Medicine

## 2021-12-15 DIAGNOSIS — Z308 Encounter for other contraceptive management: Secondary | ICD-10-CM

## 2022-01-12 ENCOUNTER — Telehealth: Payer: No Typology Code available for payment source | Admitting: Physician Assistant

## 2022-01-12 DIAGNOSIS — R112 Nausea with vomiting, unspecified: Secondary | ICD-10-CM

## 2022-01-12 DIAGNOSIS — G43909 Migraine, unspecified, not intractable, without status migrainosus: Secondary | ICD-10-CM

## 2022-01-12 DIAGNOSIS — R110 Nausea: Secondary | ICD-10-CM

## 2022-01-12 MED ORDER — ONDANSETRON 4 MG PO TBDP: 4.0000 mg | ORAL_TABLET | Freq: Three times a day (TID) | ORAL | 0 refills | Status: DC | PRN

## 2022-01-12 MED ORDER — RIZATRIPTAN BENZOATE 10 MG PO TABS
10.0000 mg | ORAL_TABLET | Freq: Once | ORAL | 0 refills | Status: DC | PRN
Start: 2022-01-12 — End: 2023-06-21

## 2022-01-12 NOTE — Progress Notes (Signed)
Virtual Visit Consent   Jennifer Roach, you are scheduled for a virtual visit with a [facility] provider today. Just as with appointments in the office, your consent must be obtained to participate. Your consent will be active for this visit and any virtual visit you may have with one of our providers in the next 365 days. If you have a MyChart account, a copy of this consent can be sent to you electronically.  As this is a virtual visit, video technology does not allow for your provider to perform a traditional examination. This may limit your provider's ability to fully assess your condition. If your provider identifies any concerns that need to be evaluated in person or the need to arrange testing (such as labs, EKG, etc.), we will make arrangements to do so. Although advances in technology are sophisticated, we cannot ensure that it will always work on either your end or our end. If the connection with a video visit is poor, the visit may have to be switched to a telephone visit. With either a video or telephone visit, we are not always able to ensure that we have a secure connection.  By engaging in this virtual visit, you consent to the provision of healthcare and authorize for your insurance to be billed (if applicable) for the services provided during this visit. Depending on your insurance coverage, you may receive a charge related to this service.  I need to obtain your verbal consent now. Are you willing to proceed with your visit today? [name] has provided verbal consent on 01/12/2022 for a virtual visit (video or telephone). Margaretann Loveless, PA-C  Date: 01/12/2022 7:53 AM  Virtual Visit via Video Note   I, Margaretann Loveless, connected with  Jennifer Roach  ([id number], 28-Jun-1996) on 01/12/22 at  7:45 AM EDT by a video-enabled telemedicine application and verified that I am speaking with the correct person using two identifiers.  Location: Patient: Virtual Visit Location  Patient: Home Provider: Virtual Visit Location Provider: Home Office   I discussed the limitations of evaluation and management by telemedicine and the availability of in person appointments. The patient expressed understanding and agreed to proceed.    History of Present Illness: Jennifer Roach is a 26 y.o. who identifies as a female who was assigned female at birth, and is being seen today for migraine.  HPI: Migraine  This is a new problem. The current episode started in the past 7 days (3 days). The problem occurs constantly. The problem has been unchanged. The pain is located in the Left unilateral and temporal region. The pain does not radiate. The pain quality is similar to prior headaches. The quality of the pain is described as pulsating. The pain is moderate. Associated symptoms include nausea, photophobia and vomiting. Pertinent negatives include no blurred vision or phonophobia. Associated symptoms comments: lightheaded. The symptoms are aggravated by bright light and menstrual cycle. Treatments tried: excedrin. The treatment provided no relief. Her past medical history is significant for migraine headaches.      Problems:  Patient Active Problem List   Diagnosis Date Noted   Migraine    Eczema 03/13/2020   Uses birth control 03/13/2020   IBS (irritable bowel syndrome) 06/19/2019    Allergies:  Allergies  Allergen Reactions   No Known Allergies    Medications:  Current Outpatient Medications:    [name] 24 FE 1-20 MG-MCG(24) tablet, TAKE 1 TABLET BY MOUTH DAILY. SCHEDULE APPT FOR FOLLOW UP  FOR REFILLS, Disp: 28 tablet, Rfl: 0   ondansetron (ZOFRAN ODT) 4 MG disintegrating tablet, Take 1 tablet (4 mg total) by mouth every 8 (eight) hours as needed for nausea or vomiting., Disp: 20 tablet, Rfl: 0   rizatriptan (MAXALT) 10 MG tablet, Take 1 tablet (10 mg total) by mouth once as needed for up to 1 dose for migraine. May repeat in 2 hours if needed, Disp: 10 tablet, Rfl:  0  Observations/Objective: Patient is well-developed, well-nourished in no acute distress.  Resting comfortably at home.  Head is normocephalic, atraumatic.  No labored breathing.  Speech is clear and coherent with logical content.  Patient is alert and oriented at baseline.    Assessment and Plan: 1. Acute migraine - rizatriptan (MAXALT) 10 MG tablet; Take 1 tablet (10 mg total) by mouth once as needed for up to 1 dose for migraine. May repeat in 2 hours if needed  Dispense: 10 tablet; Refill: 0  - Zofran sent via e-visit earlier - Rizatriptan prescribed for migraine, has worked in the past - Work note provided - Push fluids - Seek in person evaluation if not improving or if symptoms worsen  Follow Up Instructions: I discussed the assessment and treatment plan with the patient. The patient was provided an opportunity to ask questions and all were answered. The patient agreed with the plan and demonstrated an understanding of the instructions.  A copy of instructions were sent to the patient via MyChart unless otherwise noted below.    The patient was advised to call back or seek an in-person evaluation if the symptoms worsen or if the condition fails to improve as anticipated.  Time:  I spent 11 minutes with the patient via telehealth technology discussing the above problems/concerns.    Margaretann Loveless, PA-C

## 2022-01-14 ENCOUNTER — Other Ambulatory Visit: Payer: Self-pay | Admitting: Family Medicine

## 2022-01-14 DIAGNOSIS — Z308 Encounter for other contraceptive management: Secondary | ICD-10-CM

## 2022-02-17 ENCOUNTER — Telehealth: Payer: Self-pay | Admitting: Family Medicine

## 2022-02-17 DIAGNOSIS — Z308 Encounter for other contraceptive management: Secondary | ICD-10-CM

## 2022-02-17 NOTE — Telephone Encounter (Signed)
Pt is calling and would like a refill Jennifer Roach 24 FE 1-20 MG-MCG(24) tablet  [id number] - [place], [place] - [address] Phone:  [phone]  Fax:  [phone]    Pt last OV was nov 2022

## 2022-02-19 MED ORDER — HAILEY 24 FE 1-20 MG-MCG(24) PO TABS: ORAL_TABLET | ORAL | 0 refills | Status: DC

## 2022-02-19 NOTE — Telephone Encounter (Signed)
Spoke to patient regards to appointment for further refills. Patient reports that she was told she could get 1 year supply from her visit in November of last year. Rx sent in.

## 2022-03-15 ENCOUNTER — Other Ambulatory Visit: Payer: Self-pay | Admitting: Family Medicine

## 2022-03-15 DIAGNOSIS — Z308 Encounter for other contraceptive management: Secondary | ICD-10-CM

## 2022-03-18 IMAGING — DX DG ANKLE COMPLETE 3+V*L*
3 series · 3 of 3 positions shown · non-contrast
Comparison: None.

CLINICAL DATA: Pain following fall

EXAM:
LEFT ANKLE COMPLETE - 3+ VIEW

[ankle ap]
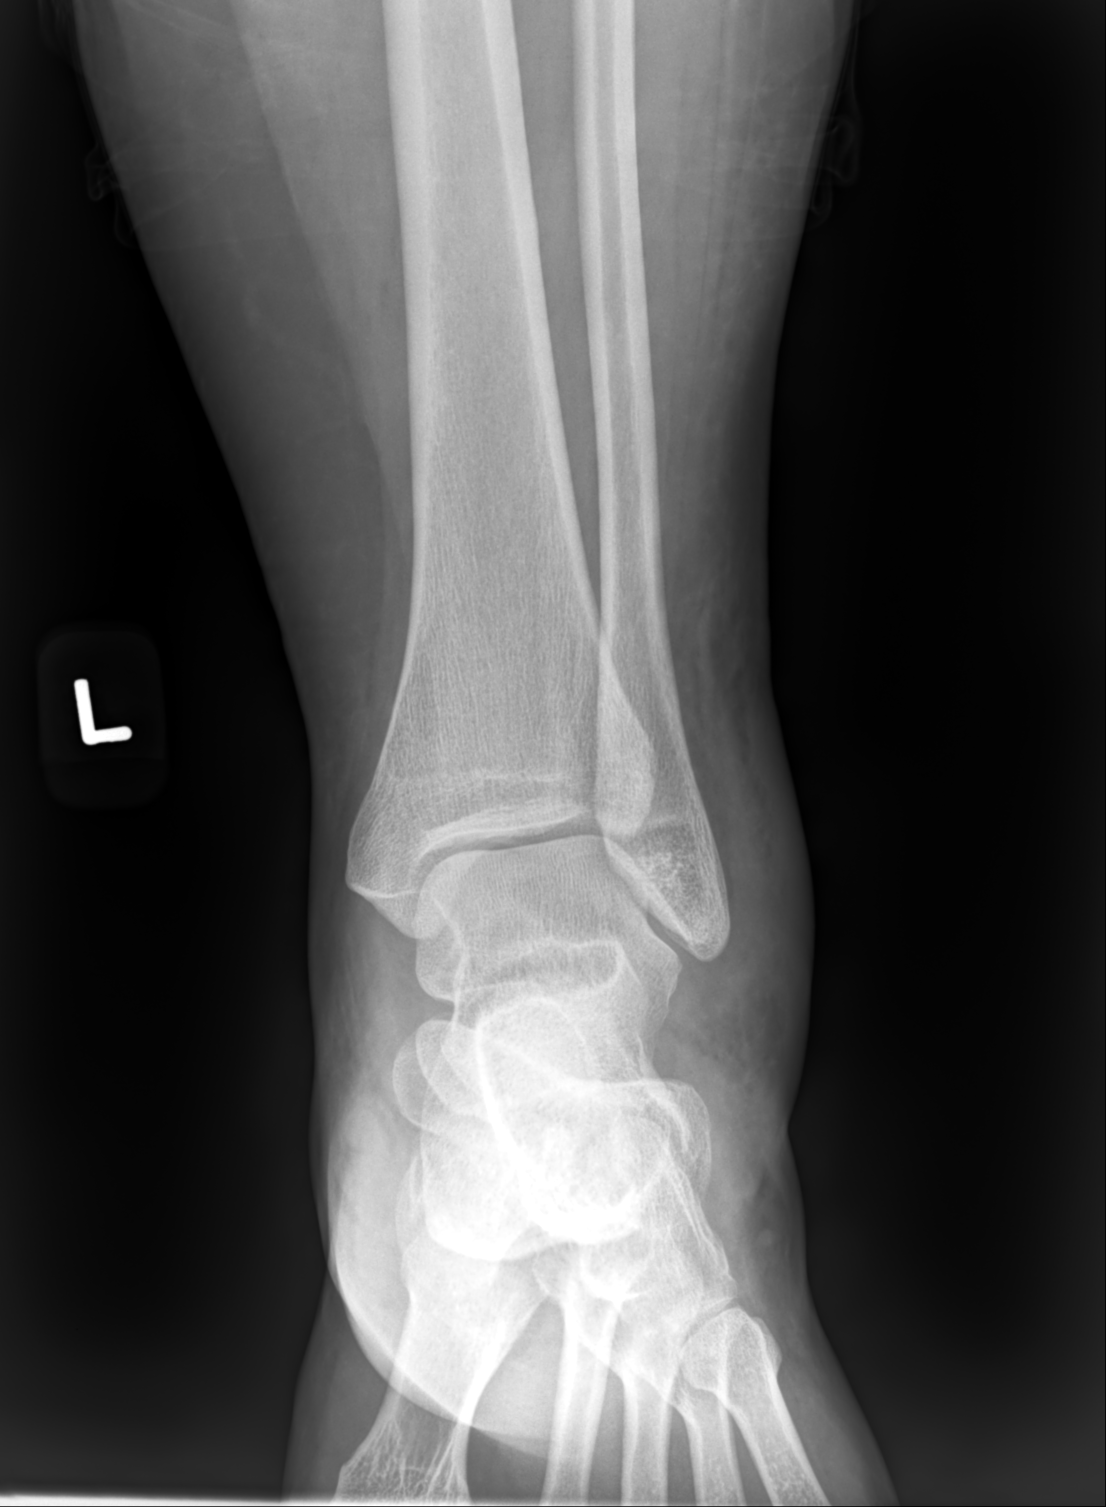

[ankle mlo]
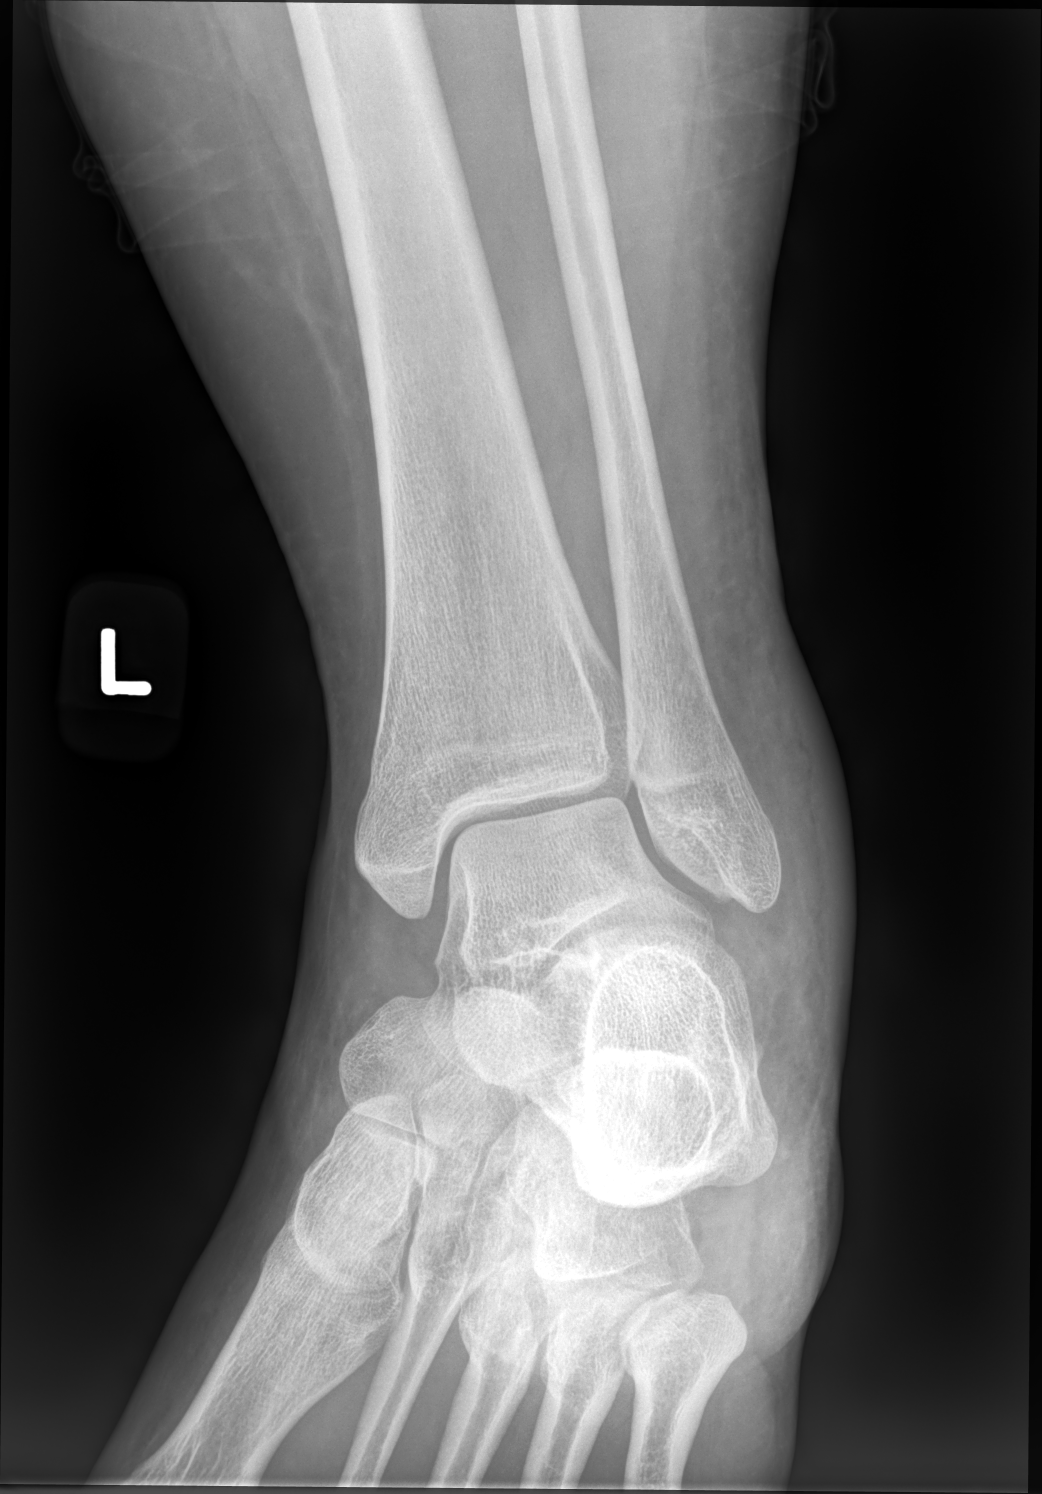

[ankle lat]
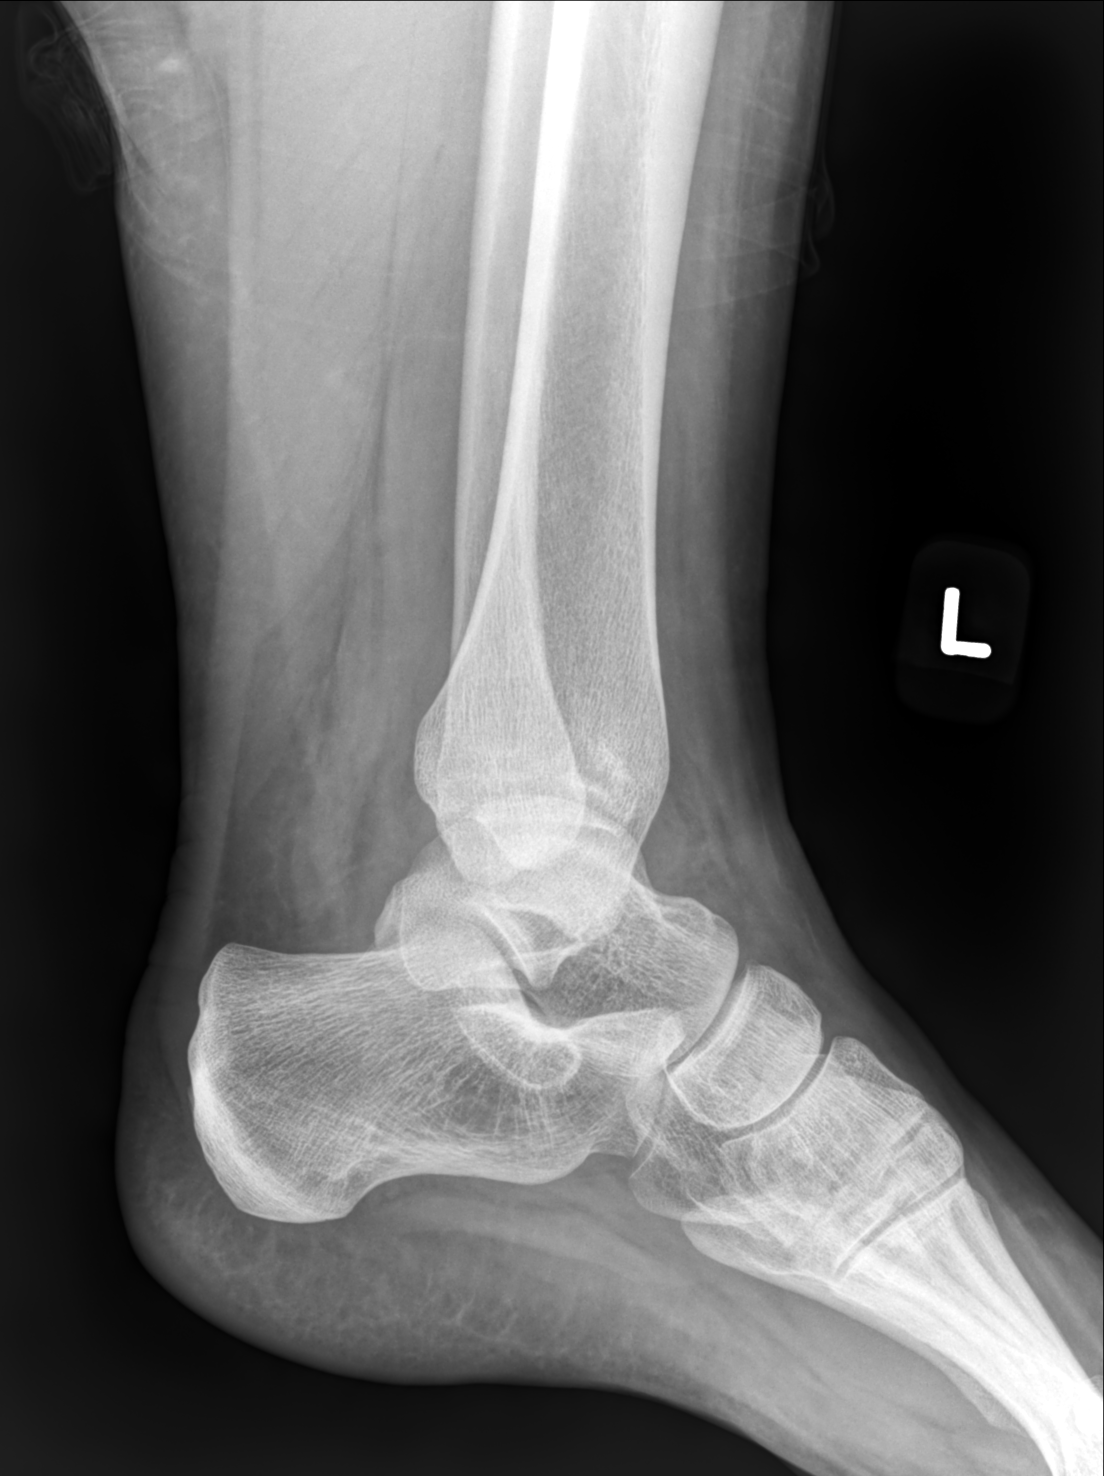

[3 of 3 positions shown; findings below may reference images not displayed]

FINDINGS: Frontal, oblique, and lateral views were obtained. There is soft
tissue swelling laterally with small joint effusion. No fracture
evident. No joint space narrowing or erosion. Ankle mortise appears
intact.
IMPRESSION: Soft tissue swelling laterally with small joint effusion. Suspect
underlying ligamentous injury. No fracture. Ankle mortise appears
intact. No appreciable underlying arthropathic change.

## 2022-06-13 ENCOUNTER — Other Ambulatory Visit: Payer: Self-pay | Admitting: Family Medicine

## 2022-06-13 DIAGNOSIS — Z308 Encounter for other contraceptive management: Secondary | ICD-10-CM

## 2023-06-21 ENCOUNTER — Telehealth: Payer: Self-pay | Admitting: Physician Assistant

## 2023-06-21 DIAGNOSIS — A084 Viral intestinal infection, unspecified: Secondary | ICD-10-CM

## 2023-06-21 DIAGNOSIS — G43909 Migraine, unspecified, not intractable, without status migrainosus: Secondary | ICD-10-CM

## 2023-06-21 MED ORDER — ONDANSETRON 4 MG PO TBDP: 4.0000 mg | ORAL_TABLET | Freq: Three times a day (TID) | ORAL | 0 refills | Status: AC | PRN

## 2023-06-21 MED ORDER — RIZATRIPTAN BENZOATE 10 MG PO TABS: 10.0000 mg | ORAL_TABLET | Freq: Once | ORAL | 0 refills | Status: AC

## 2023-06-21 NOTE — Patient Instructions (Signed)
Jennifer Roach, thank you for joining Piedad Climes, PA-C for today's virtual visit.  While this provider is not your primary care provider (PCP), if your PCP is located in our provider database this encounter information will be shared with them immediately following your visit.   A [facility] MyChart account gives you access to today's visit and all your visits, tests, and labs performed at [facility] " click here if you don't have a [facility] MyChart account or go to [email or web address]  Consent: (Patient) Jennifer Roach provided verbal consent for this virtual visit at the beginning of the encounter.  Current Medications:  Current Outpatient Medications:    [name] 24 FE 1-20 MG-MCG(24) tablet, TAKE 1 TABLET BY MOUTH DAILY. SCHEDULE APPT FOR FOLLOW UP FOR REFILLS, Disp: 28 tablet, Rfl: 2   ondansetron (ZOFRAN ODT) 4 MG disintegrating tablet, Take 1 tablet (4 mg total) by mouth every 8 (eight) hours as needed for nausea or vomiting., Disp: 20 tablet, Rfl: 0   rizatriptan (MAXALT) 10 MG tablet, Take 1 tablet (10 mg total) by mouth once as needed for up to 1 dose for migraine. May repeat in 2 hours if needed, Disp: 10 tablet, Rfl: 0   Medications ordered in this encounter:  No orders of the defined types were placed in this encounter.    *If you need refills on other medications prior to your next appointment, please contact your pharmacy*  Follow-Up: Call back or seek an in-person evaluation if the symptoms worsen or if the condition fails to improve as anticipated.  [facility] [phone]  Other Instructions Please keep hydrated and rest. Follow dietary recommendations below. You can use OTC Imodium as discussed. Take the Zofran as directed for nausea.  These stomach symptoms should continue to improve and resolve over the next few days.  For the migraine, again keep hydrated and use the Zofran as directed. Start the Maxalt, taking as  directed. If not resolving, or any worsening migraine symptoms, you need an in-person evaluation.   Viral Gastroenteritis, Adult  Viral gastroenteritis is also known as the stomach flu. This condition may affect your stomach, small intestine, and large intestine. It can cause sudden watery diarrhea, fever, and vomiting. This condition is caused by many different viruses. These viruses can be passed from person to person very easily (are contagious). Diarrhea and vomiting can make you feel weak and cause you to become dehydrated. You may not be able to keep fluids down. Dehydration can make you tired and thirsty, cause you to have a dry mouth, and decrease how often you urinate. It is important to replace the fluids that you lose from diarrhea and vomiting. What are the causes? Gastroenteritis is caused by many viruses, including rotavirus and norovirus. Norovirus is the most common cause in adults. You can get sick after being exposed to the viruses from other people. You can also get sick by: Eating food, drinking water, or touching a surface contaminated with one of these viruses. Sharing utensils or other personal items with an infected person. What increases the risk? You are more likely to develop this condition if you: Have a weak body defense system (immune system). Live with one or more children who are younger than 2 years. Live in a nursing home. Travel on cruise ships. What are the signs or symptoms? Symptoms of this condition start suddenly 1-3 days after exposure to a virus. Symptoms may last for a few days or for as  long as a week. Common symptoms include watery diarrhea and vomiting. Other symptoms include: Fever. Headache. Fatigue. Pain in the abdomen. Chills. Weakness. Nausea. Muscle aches. Loss of appetite. How is this diagnosed? This condition is diagnosed with a medical history and physical exam. You may also have a stool test to check for viruses or other  infections. How is this treated? This condition typically goes away on its own. The focus of treatment is to prevent dehydration and restore lost fluids (rehydration). This condition may be treated with: An oral rehydration solution (ORS) to replace important salts and minerals (electrolytes) in your body. Take this if told by your health care provider. This is a drink that is sold at pharmacies and retail stores. Medicines to help with your symptoms. Probiotic supplements to reduce symptoms of diarrhea. Fluids given through an IV, if dehydration is severe. Older adults and people with other diseases or a weak immune system are at higher risk for dehydration. Follow these instructions at home: Eating and drinking  Take an ORS as told by your health care provider. Drink clear fluids in small amounts as you are able. Clear fluids include: Water. Ice chips. Diluted fruit juice. Low-calorie sports drinks. Drink enough fluid to keep your urine pale yellow. Eat small amounts of healthy foods every 3-4 hours as you are able. This may include whole grains, fruits, vegetables, lean meats, and yogurt. Avoid fluids that contain a lot of sugar or caffeine, such as energy drinks, sports drinks, and soda. Avoid spicy or fatty foods. Avoid alcohol. General instructions  Wash your hands often, especially after having diarrhea or vomiting. If soap and water are not available, use hand sanitizer. Make sure that all people in your household wash their hands well and often. Take over-the-counter and prescription medicines only as told by your health care provider. Rest at home while you recover. Watch your condition for any changes. Take a warm bath to relieve any burning or pain from frequent diarrhea episodes. Keep all follow-up visits. This is important. Contact a health care provider if you: Cannot keep fluids down. Have symptoms that get worse. Have new symptoms. Feel light-headed or dizzy. Have  muscle cramps. Get help right away if you: Have chest pain. Have trouble breathing or you are breathing very quickly. Have a fast heartbeat. Feel extremely weak or you faint. Have a severe headache, a stiff neck, or both. Have a rash. Have severe pain, cramping, or bloating in your abdomen. Have skin that feels cold and clammy. Feel confused. Have pain when you urinate. Have signs of dehydration, such as: Dark urine, very little urine, or no urine. Cracked lips. Dry mouth. Sunken eyes. Sleepiness. Weakness. Have signs of bleeding, such as: Seeing blood in your vomit. Having vomit that looks like coffee grounds. Having bloody or black stools or stools that look like tar. These symptoms may be an emergency. Get help right away. Call 911. Do not wait to see if the symptoms will go away. Do not drive yourself to the hospital. Summary Viral gastroenteritis is also known as the stomach flu. It can cause sudden watery diarrhea, fever, and vomiting. This condition can be passed from person to person very easily (is contagious). Take an oral rehydration solution (ORS) if told by your health care provider. This is a drink that is sold at pharmacies and retail stores. Wash your hands often, especially after having diarrhea or vomiting. If soap and water are not available, use hand sanitizer. This information is not  intended to replace advice given to you by your health care provider. Make sure you discuss any questions you have with your health care provider. Document Revised: 05/05/2021 Document Reviewed: 05/05/2021 Elsevier Patient Education  2024 Elsevier Inc.    If you have been instructed to have an in-person evaluation today at a local Urgent Care facility, please use the link below. It will take you to a list of all of our available [facility], including address, phone number and hours of operation. Please do not delay care.  [facility]  If you or a  family member do not have a primary care provider, use the link below to schedule a visit and establish care. When you choose a [facility] primary care physician or advanced practice provider, you gain a long-term partner in health. Find a Primary Care Provider  Learn more about [facility] in-office and virtual care options:  - Get Care Now

## 2023-06-21 NOTE — Progress Notes (Signed)
Virtual Visit Consent   Jennifer Roach, you are scheduled for a virtual visit with a [facility] provider today. Just as with appointments in the office, your consent must be obtained to participate. Your consent will be active for this visit and any virtual visit you may have with one of our providers in the next 365 days. If you have a MyChart account, a copy of this consent can be sent to you electronically.  As this is a virtual visit, video technology does not allow for your provider to perform a traditional examination. This may limit your provider's ability to fully assess your condition. If your provider identifies any concerns that need to be evaluated in person or the need to arrange testing (such as labs, EKG, etc.), we will make arrangements to do so. Although advances in technology are sophisticated, we cannot ensure that it will always work on either your end or our end. If the connection with a video visit is poor, the visit may have to be switched to a telephone visit. With either a video or telephone visit, we are not always able to ensure that we have a secure connection.  By engaging in this virtual visit, you consent to the provision of healthcare and authorize for your insurance to be billed (if applicable) for the services provided during this visit. Depending on your insurance coverage, you may receive a charge related to this service.  I need to obtain your verbal consent now. Are you willing to proceed with your visit today? Jennifer Roach has provided verbal consent on 06/21/2023 for a virtual visit (video or telephone). Jennifer Roach, [place]  Date: 06/21/2023 6:34 PM  Virtual Visit via Video Note   I, Jennifer Roach, connected with  Jennifer Roach  ([id number], [birth year]) on 06/21/23 at  6:30 PM EST by a video-enabled telemedicine application and verified that I am speaking with the correct person using two identifiers.  Location: Patient: Virtual Visit Location  Patient: Home Provider: Virtual Visit Location Provider: Home Office   I discussed the limitations of evaluation and management by telemedicine and the availability of in person appointments. The patient expressed understanding and agreed to proceed.    History of Present Illness: Jennifer Roach is a 27 y.o. who identifies as a female who was assigned female at birth, and is being seen today for GI symptoms and now with migraine headache. Patient endorses symptoms starting overnight Saturday into Sunday with nausea/vomiting/diarrhea. Sunday afternoon stopped with vomiting but diarrhea has continued since that time with frequent, loose stool. Initially with fever at 100.3 but has resolved. Notes she feels tired and aching.Has been trying to stay well-hydrated and trying to rest. Was able to keep some toast down today.   Left with substantial migraine headache. Notes mainly L sided but some bilateral pain. Denies vision changes or AMS. Some mild dizziness which is common for her with her migraines. Out of her triptan.   HPI: HPI  Problems:  Patient Active Problem List   Diagnosis Date Noted   Migraine    Eczema 03/13/2020   Uses birth control 03/13/2020   IBS (irritable bowel syndrome) 06/19/2019    Allergies:  Allergies  Allergen Reactions   No Known Allergies    Medications:  Current Outpatient Medications:    ondansetron (ZOFRAN-ODT) 4 MG disintegrating tablet, Take 1 tablet (4 mg total) by mouth every 8 (eight) hours as needed for nausea or vomiting., Disp: 20 tablet, Rfl: 0  rizatriptan (MAXALT) 10 MG tablet, Take 1 tablet (10 mg total) by mouth once for 1 dose. May repeat in 2 hours if needed, Disp: 10 tablet, Rfl: 0  Observations/Objective: Patient is well-developed, well-nourished in no acute distress.  Resting comfortably at home.  Head is normocephalic, atraumatic.  No labored breathing. Speech is clear and coherent with logical content.  Patient is alert and oriented at  baseline.   Assessment and Plan: 1. Migraine without status migrainosus, not intractable, unspecified migraine type - rizatriptan (MAXALT) 10 MG tablet; Take 1 tablet (10 mg total) by mouth once for 1 dose. May repeat in 2 hours if needed  Dispense: 10 tablet; Refill: 0  2. Viral gastroenteritis - ondansetron (ZOFRAN-ODT) 4 MG disintegrating tablet; Take 1 tablet (4 mg total) by mouth every 8 (eight) hours as needed for nausea or vomiting.  Dispense: 20 tablet; Refill: 0  Improving GI symptoms. Likely triggering migraine directly and also indirectly due to fluid loss. Tolerating PO fluids and now food. BRAT diet reviewed. Supportive measures and OTC medications -- including Imodium -- discussed. Zofran per orders for nausea. Maxalt per orders for migraine as this has worked very well for her in the past. Work note provided. Strict in person evaluation precautions reviewed.  Follow Up Instructions: I discussed the assessment and treatment plan with the patient. The patient was provided an opportunity to ask questions and all were answered. The patient agreed with the plan and demonstrated an understanding of the instructions.  A copy of instructions were sent to the patient via MyChart unless otherwise noted below.   The patient was advised to call back or seek an in-person evaluation if the symptoms worsen or if the condition fails to improve as anticipated.    Jennifer Climes, PA-C
